# Patient Record
Sex: Male | Born: 1965 | Race: White | Hispanic: No | Marital: Married | Smoking: Former smoker
Health system: Southern US, Community
[De-identification: ages and names within clinical notes are randomized; demographics above are authoritative.]

## PROBLEM LIST (undated history)

## (undated) DIAGNOSIS — I10 Essential (primary) hypertension: Secondary | ICD-10-CM

## (undated) DIAGNOSIS — F419 Anxiety disorder, unspecified: Secondary | ICD-10-CM

## (undated) DIAGNOSIS — E785 Hyperlipidemia, unspecified: Secondary | ICD-10-CM

## (undated) DIAGNOSIS — T7840XA Allergy, unspecified, initial encounter: Secondary | ICD-10-CM

## (undated) DIAGNOSIS — M1811 Unilateral primary osteoarthritis of first carpometacarpal joint, right hand: Secondary | ICD-10-CM

## (undated) DIAGNOSIS — G4730 Sleep apnea, unspecified: Secondary | ICD-10-CM

## (undated) DIAGNOSIS — M6530 Trigger finger, unspecified finger: Secondary | ICD-10-CM

## (undated) DIAGNOSIS — H6690 Otitis media, unspecified, unspecified ear: Secondary | ICD-10-CM

## (undated) DIAGNOSIS — B019 Varicella without complication: Secondary | ICD-10-CM

## (undated) HISTORY — DX: Hyperlipidemia, unspecified: E78.5

## (undated) HISTORY — PX: SPINE SURGERY: SHX786

## (undated) HISTORY — DX: Sleep apnea, unspecified: G47.30

## (undated) HISTORY — PX: COLONOSCOPY: SHX174

## (undated) HISTORY — DX: Anxiety disorder, unspecified: F41.9

## (undated) HISTORY — PX: BILATERAL CARPAL TUNNEL RELEASE: SHX6508

## (undated) HISTORY — PX: EYE SURGERY: SHX253

## (undated) HISTORY — DX: Varicella without complication: B01.9

## (undated) HISTORY — DX: Otitis media, unspecified, unspecified ear: H66.90

## (undated) HISTORY — PX: HEMORROIDECTOMY: SUR656

## (undated) HISTORY — DX: Trigger finger, unspecified finger: M65.30

## (undated) HISTORY — PX: VASECTOMY: SHX75

## (undated) HISTORY — DX: Allergy, unspecified, initial encounter: T78.40XA

## (undated) HISTORY — DX: Unilateral primary osteoarthritis of first carpometacarpal joint, right hand: M18.11

## (undated) HISTORY — PX: TYMPANOSTOMY TUBE PLACEMENT: SHX32

---

## 2003-06-19 ENCOUNTER — Ambulatory Visit (HOSPITAL_BASED_OUTPATIENT_CLINIC_OR_DEPARTMENT_OTHER): Admission: RE | Admit: 2003-06-19 | Discharge: 2003-06-19 | Payer: Self-pay | Admitting: Internal Medicine

## 2004-10-25 ENCOUNTER — Emergency Department (HOSPITAL_COMMUNITY): Admission: EM | Admit: 2004-10-25 | Discharge: 2004-10-25 | Payer: Self-pay | Admitting: Emergency Medicine

## 2005-05-02 HISTORY — PX: NECK SURGERY: SHX720

## 2005-06-21 ENCOUNTER — Emergency Department (HOSPITAL_COMMUNITY): Admission: EM | Admit: 2005-06-21 | Discharge: 2005-06-21 | Payer: Self-pay | Admitting: Family Medicine

## 2005-09-28 ENCOUNTER — Emergency Department (HOSPITAL_COMMUNITY): Admission: EM | Admit: 2005-09-28 | Discharge: 2005-09-28 | Payer: Self-pay | Admitting: Emergency Medicine

## 2006-10-10 ENCOUNTER — Encounter: Admission: RE | Admit: 2006-10-10 | Discharge: 2006-10-10 | Payer: Self-pay | Admitting: *Deleted

## 2006-11-24 ENCOUNTER — Ambulatory Visit (HOSPITAL_COMMUNITY): Admission: RE | Admit: 2006-11-24 | Discharge: 2006-11-25 | Payer: Self-pay | Admitting: Neurological Surgery

## 2006-12-18 ENCOUNTER — Encounter: Admission: RE | Admit: 2006-12-18 | Discharge: 2006-12-18 | Payer: Self-pay | Admitting: Neurological Surgery

## 2007-02-19 ENCOUNTER — Encounter: Admission: RE | Admit: 2007-02-19 | Discharge: 2007-02-19 | Payer: Self-pay | Admitting: Neurological Surgery

## 2007-05-04 ENCOUNTER — Emergency Department (HOSPITAL_COMMUNITY): Admission: EM | Admit: 2007-05-04 | Discharge: 2007-05-04 | Payer: Self-pay | Admitting: Emergency Medicine

## 2007-05-24 ENCOUNTER — Encounter (INDEPENDENT_AMBULATORY_CARE_PROVIDER_SITE_OTHER): Payer: Self-pay | Admitting: General Surgery

## 2007-05-24 ENCOUNTER — Ambulatory Visit (HOSPITAL_BASED_OUTPATIENT_CLINIC_OR_DEPARTMENT_OTHER): Admission: RE | Admit: 2007-05-24 | Discharge: 2007-05-24 | Payer: Self-pay | Admitting: General Surgery

## 2007-10-03 ENCOUNTER — Inpatient Hospital Stay (HOSPITAL_COMMUNITY): Admission: EM | Admit: 2007-10-03 | Discharge: 2007-10-04 | Payer: Self-pay | Admitting: Emergency Medicine

## 2007-10-04 ENCOUNTER — Encounter: Payer: Self-pay | Admitting: Cardiology

## 2008-01-13 ENCOUNTER — Emergency Department (HOSPITAL_COMMUNITY): Admission: EM | Admit: 2008-01-13 | Discharge: 2008-01-13 | Payer: Self-pay | Admitting: Emergency Medicine

## 2008-02-05 ENCOUNTER — Encounter: Admission: RE | Admit: 2008-02-05 | Discharge: 2008-02-27 | Payer: Self-pay | Admitting: Family Medicine

## 2008-04-09 ENCOUNTER — Emergency Department (HOSPITAL_COMMUNITY): Admission: EM | Admit: 2008-04-09 | Discharge: 2008-04-09 | Payer: Self-pay | Admitting: Emergency Medicine

## 2008-06-05 IMAGING — RF DG CERVICAL SPINE 1V
1 series · 1 of 1 positions shown · non-contrast
Comparison: MRI of the cervical spine of 10/10/06.

CLINICAL DATA: HNP, C5, C6 ACDF.
 CERVICAL SPINE ? 1 VIEW:

[Series 1: run · 1 of 1 slices shown]
[im 1/1]
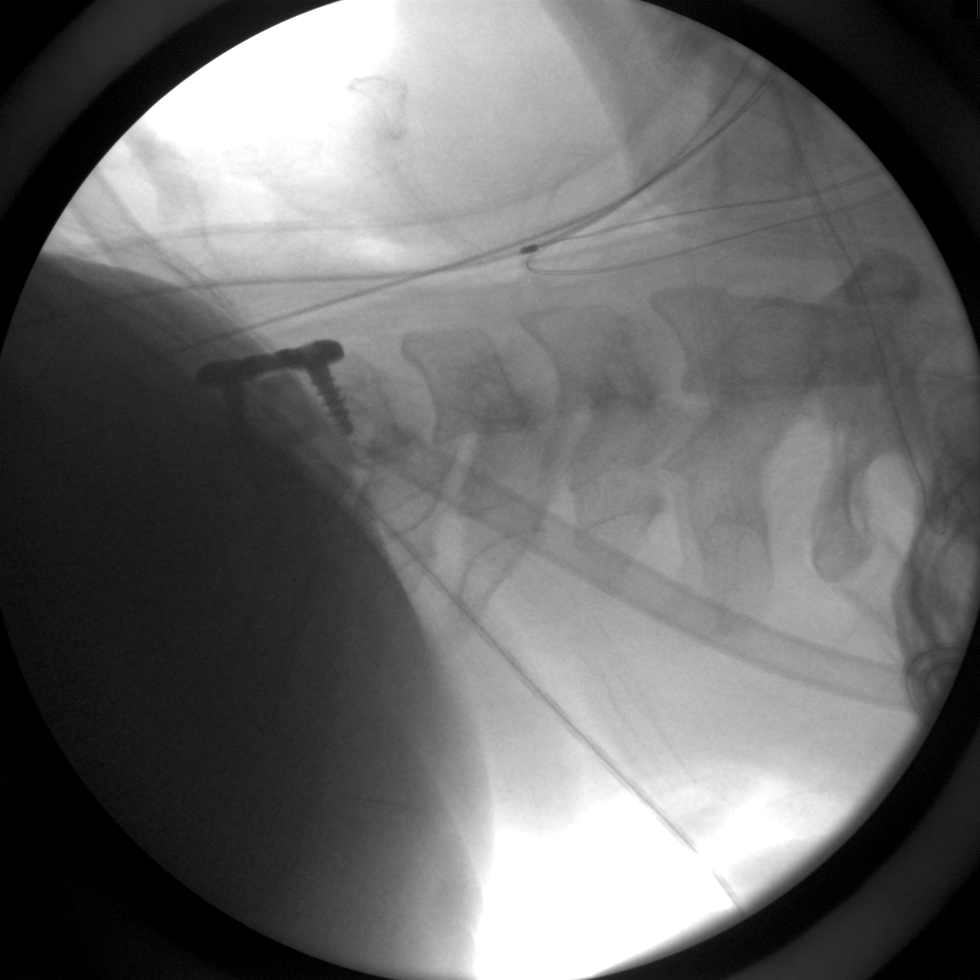

[1 of 1 positions shown; findings below may reference images not displayed]

FINDINGS: Single intraoperative spot fluoroscopic crosstable view of the cervical spine was performed.  Cervical spine is visualized from the skull base through the inferior aspect of C5.  The C6-C7 vertebral bodies are obscured by overlying soft tissue of the shoulder.  The visualized vertebral bodies are normally aligned.  Postsurgical changes of anterior cervical discectomy and fusion at C5 and C6 are noted.  Numerous wires and tubes project over the imaged portion of the neck.
IMPRESSION: Limited fluoroscopic view of the cervical spine shows postsurgical changes of C5, C6 ACDF.

## 2009-02-20 ENCOUNTER — Emergency Department (HOSPITAL_COMMUNITY): Admission: EM | Admit: 2009-02-20 | Discharge: 2009-02-20 | Payer: Self-pay | Admitting: Family Medicine

## 2009-06-02 ENCOUNTER — Emergency Department (HOSPITAL_COMMUNITY): Admission: EM | Admit: 2009-06-02 | Discharge: 2009-06-02 | Payer: Self-pay | Admitting: Family Medicine

## 2010-02-18 ENCOUNTER — Emergency Department (HOSPITAL_COMMUNITY): Admission: EM | Admit: 2010-02-18 | Discharge: 2010-02-18 | Payer: Self-pay | Admitting: Emergency Medicine

## 2010-09-14 NOTE — H&P (Signed)
NAME:  Timothy Jennings, Timothy Jennings               ACCOUNT NO.:  [id number]   MEDICAL RECORD NO.:  [id number]          PATIENT TYPE:  INP   LOCATION:  1431                         FACILITY:  [facility] : [facility]   PHYSICIAN:  Corinna L. Lendell Caprice, MDDATE OF BIRTH:  [birth year]   DATE OF ADMISSION:  10/03/2007  DATE OF DISCHARGE:                              HISTORY & PHYSICAL   CHIEF COMPLAINT:  Chest pain and fluttering.   HISTORY OF PRESENT ILLNESS:  Timothy Jennings is a 45 year old white male  patient of Dr. Azucena Cecil, who presents with recurrent episodes of  substernal chest pressure over the past 3 days.  Some, but not all are  related to exertion.  He also felt short of breath and felt palpitations  with these episodes.  He smokes two packs of cigarettes a day.  He had a  history of hyperlipidemia in the past, but is not on any medication for  this.  He denies any cocaine use and has never felt similar symptoms.  He has never had a cardiac workup.  The patient has had no anxiety or  weight loss.  He had no dizziness, diaphoresis or nausea.  He felt an  episode of chest fluttering and pressure when in radiology and received  nitroglycerin sublingually, which helped.  He was given aspirin in the  emergency room.   PAST MEDICAL HISTORY:  As above.   MEDICATIONS:  Tylenol Allergy daily.   SOCIAL HISTORY:  The patient smokes two packs of cigarettes a day.  He  works in Production designer, theatre/television/film.  He denies drug use.  He does admit to daily  alcohol use, 1-2 drinks a day.  He denies history of DTs or excessive  alcohol use.   FAMILY HISTORY:  Negative for heart disease.   REVIEW OF SYSTEMS:  As above, otherwise negative.   PHYSICAL EXAMINATION:  VITAL SIGNS:  His temperature is 98.5, pulse 70,  respiratory rate 18, blood pressure 117/61.  GENERAL:  The patient is well-nourished, well-developed in no acute  distress.  HEENT:  Normocephalic, atraumatic.  Pupils equal, round and reactive to  light.  He is a disconjugate gaze  which is chronic.  He has moist mucous  membranes.  NECK:  Supple.  No carotid bruits.  LUNGS:  Clear to auscultation bilaterally without wheezes, rhonchi or  rales.  CHEST:  He does have some chest wall tenderness, which reproduces some  of the chest pressure, but not the fluttering sensation.  ABDOMEN:  Soft, nontender, nondistended.  GU/RECTAL:  Deferred.  EXTREMITIES:  No clubbing, cyanosis or edema.  Denna Haggard' sign negative.  No calf tenderness.   LABORATORY DATA:  Ionized calcium, hemoglobin, hematocrit, basic  metabolic panel, bicarbonate all normal.  Point of care enzymes normal.  EKG shows normal sinus rhythm.  Chest x-ray shows chronic lung changes,  nothing acute.   ASSESSMENT/PLAN:  1. Chest pain and palpitations.  I will check a TSH and rule out      myocardial infarction.  Start Protonix and give morphine as needed.      Deep venous thrombosis prophylaxis.  Continue aspirin.  I have  consulted Dr. Anne Fu for recommendations.  The patient will be on      telemetry.  2. Tobacco use, counseled against.  I will get a cessation consult.  3. Possible history of hyperlipidemia.  I will check fasting lipids in      the morning.  4. Daily alcohol use.  The patient will get thiamine, although he      denies alcohol abuse.      Corinna L. Lendell Caprice, MD  Electronically Signed     CLS/MEDQ  D:  10/03/2007  T:  10/03/2007  Job:  [id number]   cc:   Tally Joe, M.D.  Fax: [phone]   Jake Bathe, MD  Fax: [phone]

## 2010-09-14 NOTE — Op Note (Signed)
NAMENOBUO, NUNZIATA               ACCOUNT NO.:  [id number]   MEDICAL RECORD NO.:  [id number]          PATIENT TYPE:  AMB   LOCATION:  SDS                          FACILITY:  [facility]   PHYSICIAN:  Tia Alert, MD     DATE OF BIRTH:  May 22, 1965   DATE OF PROCEDURE:  11/24/2006  DATE OF DISCHARGE:                               OPERATIVE REPORT   PREOPERATIVE DIAGNOSIS:  Cervical spondylosis with cervical disc  herniation C5-C6 to the right with right C6 radiculopathy.   POSTOPERATIVE DIAGNOSIS:  Cervical spondylosis with cervical disc  herniation C5-C6 to the right with right C6 radiculopathy.   PROCEDURE:  1. Depressive anterior cervical discectomy C5-C6.  2. Anterior cervical arthrodesis C5-C6 utilizing a 6-mm      corticocancellous allograft.  3. Anterior cervical plating C5-C6 utilizing a 23-mm Venture plate.   SURGEON:  Tia Alert, M.D.   ASSISTANT:  Kathaleen Maser. Pool, M.D.   ANESTHESIA:  General endotracheal anesthesia.   COMPLICATIONS:  None apparent.   INDICATIONS FOR PROCEDURE:  Mr. Smoak is a very pleasant 45 year old  gentleman who is referred with neck pain which radiated into his  interscapular region into his shoulder and down his arm with numbness  and tingling in his hand.  He had an MRI which showed cervical  spondylosis to the right at C5-C6 with the suggestion of neural  foraminal narrowing at C5-C6 to the right with compression of the right  C6 nerve root.  We talked about options of treatment. After a discussion  of the risks, benefits and expected outcome of each option, he chose to  proceed with a ACDF with plating at C5-C6.  He understood the risks,  benefits, and expected outcome and wished to proceed.   DESCRIPTION OF PROCEDURE:  The patient was taken to the operating room.  After induction of adequate generalized endotracheal anesthesia, he was  placed in the supine position on the operating table.  The right  anterior cervical region was  prepped with DuraPrep and draped in the  usual sterile fashion.  5 mL local anesthesia was injected and a  transverse incision was made to the right of the midline and carried  down to the platysma which was elevated, opened, and undermined with  Metzenbaum scissors.  I then dissected a plane medial to the  sternocleidomastoid muscle, internal carotid artery, and lateral to the  trachea and esophagus, to expose C5-C6.  Intraoperative fluoroscopy  confirmed my level at C5-C6 and then the annulus was incised and the  initial discectomy was done with pituitary rongeurs and curved curets.  The Shadowline retractors were placed under the longus colli muscles to  expose this area.   I then used a high speed drill to drill the endplates in a rectangular  fashion.  I drilled the disc space to a height of 6 mm.  I took off the  anterior inferior lip of C5 to expose the disc space.  I then brought in  the operating microscope.  The posterior longitudinal ligament was  opened with a nerve hook and then removed in  a circumferential fashion  while undercutting the bodies of C5 and C6. Bilateral foraminotomies  were performed.  We spent considerable time in the right foramen because  of the right arm pain. We marched along the pedicle and identified the  nerve root.  The nerve root had superior to inferior trajectory;  therefore, we undercut the body of C5, followed the nerve root out into  the foramen laterally, and fully exposed the right C6 nerve root.  We  then palpated with a nerve hook to assure adequate decompression of the  nerve root.  We palpated circumferentially. We felt like we had a good  decompression of the central canal and the nerve roots bilaterally.   Therefore, we measured the interspace to be 6 mm.  We dried our surgical  bed with Gelfoam, irrigated with saline solution, and then placed a 6 mm  corticocancellous allograft into the interspace at C5-C6.  We then used  a 23 mm  Venture plate and placed two 13 mm variable angle screws in the  bodies of C5 and C6 and these locked into the plate by the locking  mechanism within the plate.  We then irrigated with saline solution and  dried all bleeding points with bipolar cautery.  Once meticulous  hemostasis was achieved, we closed the platysma with 3-0 Vicryl, closed  the subcuticular tissue with 3-0 Vicryl, closed the skin with Benzoin  and Steri-Strips.  The drapes were removed.  A sterile dressing was  applied.  The patient was awakened from general anesthesia and  transferred to the recovery room in stable condition.  At the end of the  procedure, all sponge, needle and instrument counts were correct.      Tia Alert, MD  Electronically Signed     DSJ/MEDQ  D:  11/24/2006  T:  11/25/2006  Job:  [id number]

## 2010-09-14 NOTE — Op Note (Signed)
NAMEINOCENTE, KRACH               ACCOUNT NO.:  [id number]   MEDICAL RECORD NO.:  [id number]          PATIENT TYPE:  AMB   LOCATION:  DSC                          FACILITY:  [facility]   PHYSICIAN:  Cherylynn Ridges, M.D.    DATE OF BIRTH:  July 31, 1965   DATE OF PROCEDURE:  05/24/2007  DATE OF DISCHARGE:  05/04/2007                               OPERATIVE REPORT   PREOPERATIVE DIAGNOSIS:  Internal and external hemorrhoids.   POSTOPERATIVE DIAGNOSIS:  Internal and external hemorrhoids large at the  7 o'clock position, with 6 o'clock being directly posterior and moderate  size internal hemorrhoid at the 11 o'clock position.   SURGEON:  Cherylynn Ridges, M.D.   ANESTHESIA:  General with a laryngeal airway.   ESTIMATED BLOOD LOSS:  Less than 20 mL.   COMPLICATIONS:  None.   CONDITION:  Stable.   INDICATIONS FOR OPERATION:  The patient is a 45 year old that we  attempted to treat in the office with rubber band ligation and injection  for internal external hemorrhoids, who continues be symptomatic, comes  in now for an exam under anesthesia and hemorrhoidectomy.   PROCEDURE:  1. Exam under anesthesia.  2. Internal and external hemorrhoidectomy.  3. Rubber band ligation of moderate size internal hemorrhoid.   SPECIMENS SENT:  The internal and external hemorrhoids.   FINDINGS:  The patient had large internal and external hemorrhoids at 7  o'clock position with 12 o'clock being directly anterior, 6 o'clock  directly posterior and a moderate sized internal hemorrhoid at the 11  o'clock position.   OPERATION:  The patient was taken to the operating room, placed on table  in supine position.  After an adequate general laryngeal airway  anesthetic was administered, he was placed in lithotomy and then prepped  and draped in usual sterile manner.   Circumferential anoscopy with an anal speculum was performed initially  identifying moderate-sized hemorrhoid at the 11 o'clock position with 12  o'clock being directly anterior and a large internal and external  hemorrhoids at approximately the 7 o'clock position with 6 o'clock being  directly posterior.  With these being identified we went ahead and  placed anal speculum into position in order to adequately expose the  hemorrhoid at 7 o'clock position.  3-0 chromic stitch was placed at the  base of the internal component of the internal and external hemorrhoid.  Once this was done we used a 15 blade in order to excise the hemorrhoid  with electrocautery being used so as to obtain hemostasis at the denuded  mucosal tissue.  Care was taken not to incise into the external  sphincter.  Once we had control of hemostasis as well as we could with  the electrocautery, we closed the mucosal edges using running locking  stitch of the 3-0 chromic suture which had been placed at the base of  the hemorrhoid.  We brought that externally, leaving a small area open  externally in order to allow for drainage.   Once this was done, we adjusted the speculum in order to identify the  moderate-sized hemorrhoid at 11 o'clock  position.  A rubber band  ligation technique was placed on this hemorrhoids.  The initial one came  off because the lubricant which had been used; however, the second one  did remain in place.  We injected 0.25% Marcaine with epi into the  submucosal site near the excision site and then we placed a roll of  Gelfoam and dibucaine ointment into the rectal area for postoperative  hemostasis and anesthesia.  All counts were correct including needles,  sponges and instruments.  Dressing was applied.      Cherylynn Ridges, M.D.  Electronically Signed     JOW/MEDQ  D:  05/24/2007  T:  05/24/2007  Job:  [id number]   cc:   Tally Joe, M.D.

## 2010-09-14 NOTE — Consult Note (Signed)
Timothy Jennings, NYDAM               ACCOUNT NO.:  [id number]   MEDICAL RECORD NO.:  [id number]          PATIENT TYPE:  INP   LOCATION:  1431                         FACILITY:  [facility]   PHYSICIAN:  Jake Bathe, MD      DATE OF BIRTH:  20-Mar-1966   DATE OF CONSULTATION:  10/03/2007  DATE OF DISCHARGE:                                 CONSULTATION   PRIMARY CARE PHYSICIAN:  Tally Joe, M.D.   REFERRING PHYSICIAN:  Corinna L. Lendell Caprice, M.D.   REASON FOR CONSULTATION:  Timothy Jennings is being seen at the request of  Dr. Lendell Caprice for the evaluation of chest pain.   HISTORY OF PRESENT ILLNESS:  Timothy Jennings is a 45 year old male with  cardiac risk factors of tobacco use who over the past 3 to 4 days has  been experiencing chest pain, fluttering, a butterfly-like sensation,  usually lasting 1 to 5 minutes in duration, substernal with no radiation  but with associated shortness of breath that first occurred while  sitting watching TV, occurring several different times throughout the  day.  He did notice today that while he was traversing a stairway that  pain increased in intensity, was associated with more chest tightness,  and it worried him.  This prompted his evaluation at [facility]  emergency department.  Prior to this experience, he has not had any  chest pain and has no other significant past medical history.   PAST MEDICAL HISTORY:  None except for tobacco abuse.   PAST SURGICAL HISTORY:  Prior cervical neck bone spur removal, prior  knee surgery.   ALLERGIES:  No known drug allergies.   MEDICATIONS:  None currently.   FAMILY HISTORY:  No early family history of coronary artery disease.  His mother's father did have bypass surgery at a later age.   SOCIAL HISTORY:  He smokes, drinks alcohol.  No illicit drug use.  He is  a Games developer.  He recently separated from his wife, Timothy Jennings, on whom I had consulted previously in November.  She is now  living in [place]  with their child.   REVIEW OF SYSTEMS:  No syncope.  No bleeding.  No visual disturbance.  No joint pain.  No orthopnea or PND.  If not specified above, all other  12 review of systems negative.   PHYSICAL EXAMINATION:  VITAL SIGNS:  Temperature 98.5, pulse 70 to 86,  respirations 18, blood pressure 117 to 140 over 61 to 82.  Satting 96%  on 2 liters.  GENERAL:  Alert and oriented x 3, in no acute distress, comfortable in  bed with his laptop at side.  HEENT:  Well-perfused conjunctivae.  EOMI.  No scleral icterus.  NECK:  Supple.  No JVD.  No carotid bruits.  No lymphadenopathy.  No  thyromegaly.  CARDIOVASCULAR:  Regular rate and rhythm.  No murmurs, rubs or gallops.  Normal PMI.  LUNGS:  Clear to auscultation bilaterally.  Normal respiratory effort.  Mild crackles heard initially at bases, which cleared.  ABDOMEN:  Soft, nontender.  Normoactive bowel sounds.  No rebound, no  guarding.  No bruits.  EXTREMITIES:  No clubbing, cyanosis or edema.  Normal distal pulses.  NEUROLOGIC:  Nonfocal.  No tremors.  SKIN:  Skin is tan.  No rashes noted.   LABORATORIES:  EKG normal sinus rhythm with no ST changes, rate 76,  normal intervals.  Chest x-ray personally reviewed showed no acute air  space disease.  Labs:  Sodium 139, potassium 4.1, BUN 15, creatinine  1.0, glucose 91.  First set of cardiac biomarkers are normal.   ASSESSMENT/PLAN:  A 45 year old male with new onset chest pain -  atypical with palpitations and associated dyspnea.  1. Chest pain - fairly atypical presentation.  First set of cardiac      enzymes is reassuring given the occurrence of this pain over the      past 3 or 4 days.  ECG also unremarkable.  Chest x-ray      unremarkable.  Possible etiologies include angina, musculoskeletal,      GERD, or discomfort associated with concurrent palpitations.  Main      cardiac risk factor is smoking.  We will continue to cycle cardiac      enzymes.  Will place on aspirin and  low-dose beta blocker.      Depending on cardiac enzymes, we will continue with further risk      stratification with either a stress test or cardiac catheterization      if biomarkers, chest pain or ECG become concerning.  We will make      further decisions in the a.m.  2. Tobacco abuse - counseled on cessation at length.   We will follow up with the patient and make determination in a.m. versus  further testing.      Jake Bathe, MD  Electronically Signed     MCS/MEDQ  D:  10/03/2007  T:  10/03/2007  Job:  [id number]   cc:   Corinna L. Lendell Caprice, MD

## 2010-09-17 NOTE — Discharge Summary (Signed)
NAME:  ANDYN, SALES               ACCOUNT NO.:  [id number]   MEDICAL RECORD NO.:  [id number]          PATIENT TYPE:  INP   LOCATION:  1431                         [facility]ACILITY[facility]  [facility]   PHYSICIAN:  Corinna L. Lendell Caprice, MDDATE OF BIRTH:  06-15-1965   DATE OF ADMISSION:  10/03/2007  DATE OF DISCHARGE:  10/04/2007                               DISCHARGE SUMMARY   S   DISCHARGE DIAGNOSES:  1. Chest pain, MI ruled out.  2. Tobacco abuse, counseled against.  3. Reported history of hyperlipidemia, LDL during this hospitalization      was 127.   DISCHARGE MEDICATIONS:  1. Prilosec 20 mg a day.   FOLLOWUP:  Follow up with Dr. Azucena Cecil.   CONDITION:  Stable.   CONSULTATIONS:  Dr. Jake Bathe, MD.   PROCEDURES:  None.   DISCHARGE INSTRUCTIONS:  1. Diet:  Low cholesterol.  2. Activity:  Ad lib.   LABORATORY DATA:  Serial cardiac enzymes negative.  Complete metabolic  panel, CBC and D-dimer, unremarkable.  Total cholesterol 179,  triglycerides 141, HDL of 24, LDL 127.  TSH 1.541.   SPECIAL STUDIES/RADIOLOGY:  Chest x-ray showed chronic lung changes,  nothing acute.  Stress Cardiolite showed normal study, ejection fraction  58%.  EKG showed normal sinus rhythm.   HISTORY AND HOSPITAL COURSE:  Mr. Trull is a 45 year old white male  with history of tobacco abuse and reported hyperlipidemia who presented  with 3 days worth of recurrent substernal chest pressure.  It was  sometimes related to exertion.  He also felt palpitations.  He had  normal vital signs.  He had some reproducible chest wall tenderness.  The patient was admitted to telemetry where he ruled out for MI.  Stress  Cardiolite was negative.  He was encouraged to quit smoking and started  on Prilosec.  D-dimer also was negative.      Corinna L. Lendell Caprice, MD  Electronically Signed     CLS/MEDQ  D:  12/12/2007  T:  12/13/2007  Job:  [id number]

## 2011-01-20 LAB — DIFFERENTIAL
Basophils Absolute: 0
Eosinophils Relative: 1
Lymphocytes Relative: 15
Neutro Abs: 8.8 — ABNORMAL HIGH

## 2011-01-20 LAB — BASIC METABOLIC PANEL
CO2: 27
Creatinine, Ser: 0.89
GFR calc Af Amer: >60
GFR calc non Af Amer: >60
Sodium: 137

## 2011-01-20 LAB — CBC
HCT: 45.9
MCV: 89.7
RDW: 12.9
WBC: 11.1 — ABNORMAL HIGH

## 2011-01-27 LAB — POCT I-STAT, CHEM 8
BUN: 15
Glucose, Bld: 91
Hemoglobin: 16
Sodium: 139

## 2011-01-27 LAB — POCT CARDIAC MARKERS
Myoglobin, poc: 40.2
Operator id: 280141

## 2011-01-27 LAB — LIPID PANEL
Cholesterol: 179
LDL Cholesterol: 127 — ABNORMAL HIGH
Triglycerides: 141
VLDL: 28

## 2011-01-27 LAB — CBC
HCT: 43.5
MCV: 89.8
RDW: 13
WBC: 8.2

## 2011-01-27 LAB — CARDIAC PANEL(CRET KIN+CKTOT+MB+TROPI)
Relative Index: 0.8
Total CK: 123
Total CK: 133
Troponin I: 0.02
Troponin I: 0.02

## 2011-01-27 LAB — TSH: TSH: 1.541

## 2011-01-27 LAB — HEPATIC FUNCTION PANEL
AST: 18
Indirect Bilirubin: 0.7

## 2011-02-14 LAB — DIFFERENTIAL
Basophils Absolute: 0.1
Basophils Relative: 1
Eosinophils Absolute: 0.2
Eosinophils Relative: 2
Lymphocytes Relative: 16
Monocytes Absolute: 0.8 — ABNORMAL HIGH
Monocytes Relative: 6
Neutrophils Relative %: 76

## 2011-02-14 LAB — CBC
HCT: 46.6
MCV: 89.6
RBC: 5.2
RDW: 13.5
WBC: 13.7 — ABNORMAL HIGH

## 2011-02-14 LAB — APTT: aPTT: 28

## 2011-02-14 LAB — BASIC METABOLIC PANEL
Chloride: 104
Glucose, Bld: 84

## 2011-11-15 ENCOUNTER — Emergency Department (HOSPITAL_COMMUNITY): Payer: Managed Care, Other (non HMO)

## 2011-11-15 ENCOUNTER — Encounter (HOSPITAL_COMMUNITY): Payer: Self-pay | Admitting: Emergency Medicine

## 2011-11-15 ENCOUNTER — Emergency Department (HOSPITAL_COMMUNITY)
Admission: EM | Admit: 2011-11-15 | Discharge: 2011-11-15 | Disposition: A | Discharge status: Home / Self Care | Payer: Managed Care, Other (non HMO) | Attending: Emergency Medicine | Admitting: Emergency Medicine

## 2011-11-15 DIAGNOSIS — W01119A Fall on same level from slipping, tripping and stumbling with subsequent striking against unspecified sharp object, initial encounter: Secondary | ICD-10-CM | POA: Insufficient documentation

## 2011-11-15 DIAGNOSIS — IMO0002 Reserved for concepts with insufficient information to code with codable children: Secondary | ICD-10-CM

## 2011-11-15 DIAGNOSIS — Y92009 Unspecified place in unspecified non-institutional (private) residence as the place of occurrence of the external cause: Secondary | ICD-10-CM | POA: Insufficient documentation

## 2011-11-15 DIAGNOSIS — S61509A Unspecified open wound of unspecified wrist, initial encounter: Secondary | ICD-10-CM | POA: Insufficient documentation

## 2011-11-15 DIAGNOSIS — W268XXA Contact with other sharp object(s), not elsewhere classified, initial encounter: Secondary | ICD-10-CM | POA: Insufficient documentation

## 2011-11-15 DIAGNOSIS — Z23 Encounter for immunization: Secondary | ICD-10-CM | POA: Insufficient documentation

## 2011-11-15 MED ORDER — TETANUS-DIPHTH-ACELL PERTUSSIS 5-2.5-18.5 LF-MCG/0.5 IM SUSP
0.5000 mL | Freq: Once | INTRAMUSCULAR | Status: AC
  Administered 2011-11-15: 0.5 mL via INTRAMUSCULAR
  Filled 2011-11-15: qty 0.5

## 2011-11-15 NOTE — ED Notes (Signed)
Neva Seat, PA at bedside suturing pt.

## 2011-11-15 NOTE — ED Provider Notes (Signed)
History     CSN: [id number]  Arrival date & time 11/15/11  1735   First MD Initiated Contact with Patient 11/15/11 1835      Chief Complaint  Patient presents with  . Extremity Laceration    (Consider location/radiation/quality/duration/timing/severity/associated sxs/prior treatment) HPI  [facility] department with complaints of laceration to left wrist. He states that his dog data to see him and tripped him and fell on some sheet metal. He denies having pain to the area. He says his last tetanus shot was more than 10 years ago. He denies head pain or injury to another area of his body. Pt is in NAD, bleeding controlled and vital signs stable  History reviewed. No pertinent past medical history.  History reviewed. No pertinent past surgical history.  No family history on file.  History  Substance Use Topics  . Smoking status: Not on file  . Smokeless tobacco: Not on file  . Alcohol Use: Not on file      Review of Systems   HEENT: denies blurry vision or change in hearing PULMONARY: Denies difficulty breathing and SOB CARDIAC: denies chest pain or heart palpitations MUSCULOSKELETAL:  denies being unable to ambulate ABDOMEN AL: denies abdominal pain GU: denies loss of bowel or urinary control NEURO: denies numbness and tingling in extremities SKIN: no new rashes, + laceration to left wrist PSYCH: patient denies anxiety or depression. NECK: Pt denies having neck pain     Allergies  Review of patient's allergies indicates no known allergies.  Home Medications   Current Outpatient Rx  Name Route Sig Dispense Refill  . DEXBROMPHENIRAMINE-PSE ER 6-120 MG PO TB12 Oral Take 1 tablet by mouth every 12 (twelve) hours as needed. For congestion      BP 148/89  Pulse 85  Temp 98.8 F (37.1 C) (Oral)  SpO2 98%  Physical Exam  Nursing note and vitals reviewed. Constitutional: He appears well-developed and well-nourished. No distress.  HENT:  Head: Normocephalic and  atraumatic.  Eyes: Pupils are equal, round, and reactive to light.  Neck: Normal range of motion. Neck supple.  Cardiovascular: Normal rate and regular rhythm.   Pulmonary/Chest: Effort normal.  Abdominal: Soft.  Neurological: He is alert.  Skin: Skin is warm and dry.       1.5 cm laceration over distal radial head  Cap refill < 3 seconds FROM No numbness or tingling to fingers     ED Course  Procedures (including critical care time)  Labs Reviewed - No data to display Dg Wrist Complete Left  11/15/2011  *RADIOLOGY REPORT*  Clinical Data: Fall today.  Laceration to the anterior and ulnar aspect of wrist.  LEFT WRIST - COMPLETE 3+ VIEW  Comparison: None.  Findings: No acute fracture or dislocation.  Scaphoid intact.  No definite soft tissue injury. No radio-opaque foreign body.  IMPRESSION: No acute osseous abnormality.  Original Report Authenticated By: Consuello Bossier, M.D.     1. Laceration       MDM  .LACERATION REPAIR Performed by: Dorthula Matas Authorized by: Dorthula Matas Consent: Verbal consent obtained. Risks and benefits: risks, benefits and alternatives were discussed Consent given by: patient Patient identity confirmed: provided demographic data Prepped and Draped in normal sterile fashion Wound explored  Laceration Location: left wrist  Laceration Length: 1.5 cm  No Foreign Bodies seen or palpated  Anesthesia: local infiltration  Local anesthetic: lidocaine 2% wo epinephrine  Anesthetic total: 3 ml  Irrigation method: syringe Amount of cleaning: standard  Skin closure:  sutures  Number of sutures: 5  Technique: simple interrupted.  Patient tolerance: Patient tolerated the procedure well with no immediate complications.   Pt given tetanus shot in ED. Advised on s/sx that warrant return to ED. Wound cleaned thoroughly. To return to ER in 7-10 days for suture removal.  Pt has been advised of the symptoms that warrant their return to the  ED. Patient has voiced understanding and has agreed to follow-up with the PCP or specialist.       Dorthula Matas, PA 11/15/11 1925

## 2011-11-15 NOTE — ED Notes (Signed)
Pt states he had his last tetanus shot in 2002.

## 2011-11-15 NOTE — ED Notes (Signed)
Pt has laceration to L lateral wrist. Pt states he tripped and fell and landed on some sharp sheet metal. Lac to L lateral wrist. No bleeding at present.

## 2011-11-16 NOTE — ED Provider Notes (Signed)
Medical screening examination/treatment/procedure(s) were performed by non-physician practitioner and as supervising physician I was immediately available for consultation/collaboration.   Gwyneth Sprout, MD 11/16/11 2311

## 2012-02-20 ENCOUNTER — Ambulatory Visit (INDEPENDENT_AMBULATORY_CARE_PROVIDER_SITE_OTHER): Payer: Managed Care, Other (non HMO) | Admitting: Family Medicine

## 2012-02-20 ENCOUNTER — Encounter: Payer: Self-pay | Admitting: Family Medicine

## 2012-02-20 VITALS — BP 118/80 | HR 94 | Temp 98.9°F | Ht 70.0 in | Wt 213.0 lb

## 2012-02-20 DIAGNOSIS — Z309 Encounter for contraceptive management, unspecified: Secondary | ICD-10-CM

## 2012-02-20 DIAGNOSIS — Z0000 Encounter for general adult medical examination without abnormal findings: Secondary | ICD-10-CM

## 2012-02-20 DIAGNOSIS — Z299 Encounter for prophylactic measures, unspecified: Secondary | ICD-10-CM

## 2012-02-20 DIAGNOSIS — IMO0001 Reserved for inherently not codable concepts without codable children: Secondary | ICD-10-CM

## 2012-02-20 LAB — COMPREHENSIVE METABOLIC PANEL
AST: 25 U/L (ref 0–37)
Albumin: 4.1 g/dL (ref 3.5–5.2)
Alkaline Phosphatase: 61 U/L (ref 39–117)
Glucose, Bld: 76 mg/dL (ref 70–99)
Potassium: 4 mEq/L (ref 3.5–5.1)
Sodium: 139 mEq/L (ref 135–145)
Total Bilirubin: 0.3 mg/dL (ref 0.3–1.2)
Total Protein: 7.5 g/dL (ref 6.0–8.3)

## 2012-02-20 LAB — LDL CHOLESTEROL, DIRECT: Direct LDL: 124.7 mg/dL

## 2012-02-20 LAB — LIPID PANEL
HDL: 37.5 mg/dL — ABNORMAL LOW (ref >39.00)
Total CHOL/HDL Ratio: 6
VLDL: 74 mg/dL — ABNORMAL HIGH (ref 0.0–40.0)

## 2012-02-20 LAB — HEMOGLOBIN A1C: Hgb A1c MFr Bld: 5.4 % (ref 4.6–6.5)

## 2012-02-20 NOTE — Progress Notes (Signed)
Chief Complaint  Patient presents with  . Establish Care    HPI: Timothy Jennings is here to establish care. Needs PCP.  Has the following concerns today: -wants basic labs and physical - hx of hyperlipidemia -no depression -wants referral for vasectomy  Other Providers: -[facility]: for recurrent otitis media, tube in R ear  Flu vaccine: Does not want flu vaccine Had tetanus this year  ROS: See pertinent positives and negatives per HPI.  Past Medical History  Diagnosis Date  . Otitis media     followed by ENT     Family History  Problem Relation Age of Onset  . Hypertension Paternal Grandmother   . Heart disease Paternal Grandmother   . Hypertension Paternal Grandfather   . Heart disease Paternal Grandfather     History   Social History  . Marital Status: Divorced    Spouse Name: N/A    Number of Children: N/A  . Years of Education: N/A   Social History Main Topics  . Smoking status: Former Games developer  . Smokeless tobacco: None  . Alcohol Use: 8.4 oz/week    14 Cans of beer per week     14 per week  . Drug Use: None  . Sexually Active: None   Other Topics Concern  . None   Social History Narrative  . None    Current outpatient prescriptions:dexbrompheniramine-pseudoephedrine (DRIXORAL) 6-120 MG per tablet, Take 1 tablet by mouth every 12 (twelve) hours as needed. For congestion, Disp: , Rfl:   EXAM:  Filed Vitals:   02/20/12 1420  BP: 118/80  Pulse: 94  Temp: 98.9 F (37.2 C)    Body mass index is 30.56 kg/(m^2).  GENERAL: vitals reviewed and listed above, alert, oriented, appears well hydrated and in no acute distress  HEENT: atraumatic, conjunttiva clear, no obvious abnormalities on inspection of external nose and ears  NECK: no obvious masses on inspection  LUNGS: clear to auscultation bilaterally, no wheezes, rales or rhonchi, good air movement  CV: HRRR, no peripheral edema  MS: moves all extremities without noticeable  abnormality  PSYCH: pleasant and cooperative, no obvious depression or anxiety  ASSESSMENT AND PLAN:  Discussed the following assessment and plan:  1. Preventive measure  Lipid Panel, Hemoglobin A1c, CMP  2. Contraception  Ambulatory referral to Urology   -We reviewed the PMH, PSH, FH, SH, Meds and Allergies. -We addressed current concerns per orders and patient instructions. -Level A and B USPSTF recommendations reviewed and counseling provided -alcohol safe drinking counseling provided -We have advised patient to follow up per instructions below. -Influenza vaccine given today  -Patient advised to return or notify a doctor immediately if symptoms worsen or persist or new concerns arise.  Patient Instructions  -We have ordered labs or studies at this visit. It usually takes 1-2 weeks for results and processing. We will contact you with instructions IF your results are abnormal. Normal results will be released to your [facility] in 1-2 weeks. If you have not heard from [place] or can not find your results in [facility] in 2 weeks please contact our office.  -We placed a referral for you as discussed. It usually takes about 1-2 weeks to process and schedule this referral. If you have not heard from [place] regarding this appointment in 2 weeks please contact our office.  Thank you for enrolling in MyChart. Please follow the instructions below to securely access your online medical record. MyChart allows you to send messages to your doctor, view your  test results, renew your prescriptions, schedule appointments, and more.  How Do I Sign Up? 1. In your Internet browser, go to [email or web address] 2. Click on the New  User? link in the Sign In box.  3. Enter your MyChart Access Code exactly as it appears below. You will not need to use this code after you have completed the sign-up process. If you do not sign up before the expiration date, you must request a new code. MyChart Access Code:  [email or web address] Expires: 03/21/2012  2:42 PM  4. Enter the last four digits of your Social Security Number (xxxx) and Date of Birth (mm/dd/yyyy) as indicated and click Next. You will be taken to the next sign-up page. 5. Create a MyChart ID. This will be your MyChart login ID and cannot be changed, so think of one that is secure and easy to remember. 6. Create a MyChart password. You can change your password at any time. 7. Enter your Password Reset Question and Answer and click Next. This can be used at a later time if you forget your password.  8. Select your communication preference, and if applicable enter your e-mail address. You will receive e-mail notification when new information is available in MyChart by choosing to receive e-mail notifications and filling in your e-mail. 9. Click Sign In. You can now view your medical record.   Additional Information If you have questions, you can email [email or web address] or call [phone] to talk to our MyChart staff. Remember, MyChart is NOT to be used for urgent needs. For medical emergencies, dial 911.             Kriste Basque R.

## 2012-02-20 NOTE — Patient Instructions (Addendum)
-  We have ordered labs or studies at this visit. It usually takes 1-2 weeks for results and processing. We will contact you with instructions IF your results are abnormal. Normal results will be released to your [facility] in 1-2 weeks. If you have not heard from [place] or can not find your results in [facility] in 2 weeks please contact our office.  -We placed a referral for you as discussed. It usually takes about 1-2 weeks to process and schedule this referral. If you have not heard from [place] regarding this appointment in 2 weeks please contact our office.  Thank you for enrolling in MyChart. Please follow the instructions below to securely access your online medical record. MyChart allows you to send messages to your doctor, view your test results, renew your prescriptions, schedule appointments, and more.  How Do I Sign Up? 1. In your Internet browser, go to [email or web address] 2. Click on the New  User? link in the Sign In box.  3. Enter your MyChart Access Code exactly as it appears below. You will not need to use this code after you have completed the sign-up process. If you do not sign up before the expiration date, you must request a new code. MyChart Access Code: [email or web address] Expires: 03/21/2012  2:42 PM  4. Enter the last four digits of your Social Security Number (xxxx) and Date of Birth (mm/dd/yyyy) as indicated and click Next. You will be taken to the next sign-up page. 5. Create a MyChart ID. This will be your MyChart login ID and cannot be changed, so think of one that is secure and easy to remember. 6. Create a MyChart password. You can change your password at any time. 7. Enter your Password Reset Question and Answer and click Next. This can be used at a later time if you forget your password.  8. Select your communication preference, and if applicable enter your e-mail address. You will receive e-mail notification when new information is available in MyChart by choosing  to receive e-mail notifications and filling in your e-mail. 9. Click Sign In. You can now view your medical record.   Additional Information If you have questions, you can email [email or web address] or call [phone] to talk to our MyChart staff. Remember, MyChart is NOT to be used for urgent needs. For medical emergencies, dial 911.

## 2012-02-21 ENCOUNTER — Telehealth: Payer: Self-pay | Admitting: Family Medicine

## 2012-02-21 DIAGNOSIS — E781 Pure hyperglyceridemia: Secondary | ICD-10-CM

## 2012-02-21 DIAGNOSIS — E785 Hyperlipidemia, unspecified: Secondary | ICD-10-CM

## 2012-02-21 HISTORY — DX: Hyperlipidemia, unspecified: E78.5

## 2012-02-21 NOTE — Telephone Encounter (Signed)
Please let him know cholesterol is higher then several years ago. It will be very important to work on a healthy diet and regular exercise. I advise a follow up appointment in 3-4 months with fasting labs few days prior to that appt to recheck and discuss. Thanks.

## 2012-02-21 NOTE — Telephone Encounter (Signed)
Called and spoke with pt and pt is aware.  

## 2012-06-16 ENCOUNTER — Other Ambulatory Visit: Payer: Self-pay

## 2013-03-03 ENCOUNTER — Ambulatory Visit (INDEPENDENT_AMBULATORY_CARE_PROVIDER_SITE_OTHER): Payer: 59 | Admitting: Family Medicine

## 2013-03-03 ENCOUNTER — Ambulatory Visit: Payer: 59

## 2013-03-03 VITALS — BP 124/70 | HR 91 | Temp 99.0°F | Resp 18 | Ht 70.0 in | Wt 213.0 lb

## 2013-03-03 DIAGNOSIS — R0602 Shortness of breath: Secondary | ICD-10-CM

## 2013-03-03 DIAGNOSIS — R05 Cough: Secondary | ICD-10-CM

## 2013-03-03 DIAGNOSIS — J209 Acute bronchitis, unspecified: Secondary | ICD-10-CM

## 2013-03-03 DIAGNOSIS — R509 Fever, unspecified: Secondary | ICD-10-CM

## 2013-03-03 DIAGNOSIS — R059 Cough, unspecified: Secondary | ICD-10-CM

## 2013-03-03 LAB — POCT CBC
Granulocyte percent: 79.2 %G (ref 37–80)
HCT, POC: 45.4 % (ref 43.5–53.7)
Hemoglobin: 14.5 g/dL (ref 14.1–18.1)
Lymph, poc: 2.1 (ref 0.6–3.4)
MCH, POC: 30 pg (ref 27–31.2)
MCHC: 31.9 g/dL (ref 31.8–35.4)
MCV: 93.8 fL (ref 80–97)
MID (cbc): 1 — AB (ref 0–0.9)
MPV: 9.6 fL (ref 0–99.8)
POC Granulocyte: 11.9 — AB (ref 2–6.9)
POC LYMPH PERCENT: 14.2 %L (ref 10–50)
POC MID %: 6.6 %M (ref 0–12)
Platelet Count, POC: 324 10*3/uL (ref 142–424)
RBC: 4.84 M/uL (ref 4.69–6.13)
RDW, POC: 14.4 %
WBC: 15 10*3/uL — AB (ref 4.6–10.2)

## 2013-03-03 MED ORDER — AZITHROMYCIN 250 MG PO TABS: ORAL_TABLET | ORAL | Status: DC

## 2013-03-03 MED ORDER — HYDROCODONE-HOMATROPINE 5-1.5 MG/5ML PO SYRP: 5.0000 mL | ORAL_SOLUTION | Freq: Three times a day (TID) | ORAL | Status: DC | PRN

## 2013-03-03 MED ORDER — ALBUTEROL SULFATE (2.5 MG/3ML) 0.083% IN NEBU: 2.5000 mg | INHALATION_SOLUTION | Freq: Once | RESPIRATORY_TRACT | Status: DC

## 2013-03-03 NOTE — Progress Notes (Signed)
Subjective:    Patient ID: Timothy Jennings, male    DOB: Aug 23, 1965, 47 y.o.   MRN: [id number]  Fever  Associated symptoms include chest pain, congestion and coughing. Pertinent negatives include no abdominal pain, ear pain, headaches, nausea, sore throat or vomiting.  Cough Associated symptoms include chest pain, chills, a fever and shortness of breath. Pertinent negatives include no ear pain, headaches or sore throat.   47 year old male presents for evaluation of 6 day history of fever, cough, and nasal congestion.  States symptoms started acutely with cough and fever and have progressively gotten worse. States cough is not productive except in the morning. Fever at home has been highest of 100.1.  Complains of SOB and chest pain while coughing and taking deep inspirations.  Has been taking theraflu, aleve, and tylenol which helps with the fever and aches but otherwise has not done anything to help him.  Denies sore throat, ear pain, headache, dizziness, nausea, vomiting, or abdominal pain.  [facility] and was given amoxicillin to take. Diagnosed with allergies and possible ear infection.  States he has taken 2 doses and does not feel any better today.  Does have a hx of seasonal allergies and has an albuterol inhaler at home that he uses as as needed. He has not used it during this illness.   He does continue to be a light smoker, 1 pack every 2-3 days.  Has quit in the past but has recently begun smoking again.     Review of Systems  Constitutional: Positive for fever and chills.  HENT: Positive for congestion. Negative for ear pain, sinus pressure and sore throat.   Respiratory: Positive for cough, chest tightness and shortness of breath.   Cardiovascular: Positive for chest pain.  Gastrointestinal: Negative for nausea, vomiting and abdominal pain.  Neurological: Negative for dizziness and headaches.       Objective:   Physical Exam  Constitutional: He is  oriented to person, place, and time. He appears well-developed and well-nourished.  HENT:  Head: Normocephalic and atraumatic.  Right Ear: Hearing, tympanic membrane, external ear and ear canal normal.  Left Ear: Hearing, tympanic membrane, external ear and ear canal normal.  Mouth/Throat: Uvula is midline, oropharynx is clear and moist and mucous membranes are normal.  Eyes: Conjunctivae are normal.  Neck: Normal range of motion. Neck supple.  Cardiovascular: Normal rate, regular rhythm and normal heart sounds.   Pulmonary/Chest: Effort normal and breath sounds normal.  Lymphadenopathy:    He has no cervical adenopathy.  Neurological: He is alert and oriented to person, place, and time.  Psychiatric: He has a normal mood and affect. His behavior is normal. Judgment and thought content normal.      Results for orders placed in visit on 03/03/13  POCT CBC      Result Value Range   WBC 15.0 (*) 4.6 - 10.2 K/uL   Lymph, poc 2.1  0.6 - 3.4   POC LYMPH PERCENT 14.2  10 - 50 %L   MID (cbc) 1.0 (*) 0 - 0.9   POC MID % 6.6  0 - 12 %M   POC Granulocyte 11.9 (*) 2 - 6.9   Granulocyte percent 79.2  37 - 80 %G   RBC 4.84  4.69 - 6.13 M/uL   Hemoglobin 14.5  14.1 - 18.1 g/dL   HCT, POC 40.9  81.1 - 53.7 %   MCV 93.8  80 - 97 fL  MCH, POC 30.0  27 - 31.2 pg   MCHC 31.9  31.8 - 35.4 g/dL   RDW, POC 91.4     Platelet Count, POC 324  142 - 424 K/uL   MPV 9.6  0 - 99.8 fL   [facility] reading (PRIMARY) by  Dr. Clelia Croft as no acute infiltrate or consolidation. Increased markings throughout.  Peak flow pre nebulizer 400 (target 550)     Assessment & Plan:  Acute bronchitis  Cough - Plan: POCT CBC, DG Chest 2 View  Fever, unspecified - Plan: POCT CBC, DG Chest 2 View  Shortness of breath - Plan: albuterol (PROVENTIL) (2.5 MG/3ML) 0.083% nebulizer solution 2.5 mg  D/C amoxicillin Start Zpack as directed Hycodan cough syrup q8hours prn cough - use sparingly Continue Mucinex as  directed Albuterol inhaler (pt has already) use q4-6hours to help with SOB and wheezing If symptoms persist and patient still febrile in 48 hours, recommend recheck - sooner if worse.

## 2013-03-04 NOTE — Progress Notes (Signed)
Reviewed documentation and xray and agree w/ assessment and plan. Eva Shaw, MD MPH   

## 2013-03-07 ENCOUNTER — Other Ambulatory Visit: Payer: Self-pay

## 2013-05-24 ENCOUNTER — Encounter: Payer: Self-pay | Admitting: Family Medicine

## 2013-05-24 NOTE — Progress Notes (Signed)
[facility] for an allergy consult.  Pt referred for allergy testing.  Pt to continue Nasonex, sudafed, ciprodex otic supsension and fexofenadine hcl.  Sent to scan.

## 2013-12-06 ENCOUNTER — Ambulatory Visit (INDEPENDENT_AMBULATORY_CARE_PROVIDER_SITE_OTHER): Payer: 59 | Admitting: Family Medicine

## 2013-12-06 ENCOUNTER — Other Ambulatory Visit: Payer: Self-pay | Admitting: Family Medicine

## 2013-12-06 ENCOUNTER — Encounter: Payer: Self-pay | Admitting: Family Medicine

## 2013-12-06 VITALS — BP 118/78 | HR 81 | Temp 98.5°F | Ht 70.0 in | Wt 209.0 lb

## 2013-12-06 DIAGNOSIS — R197 Diarrhea, unspecified: Secondary | ICD-10-CM

## 2013-12-06 LAB — COMPREHENSIVE METABOLIC PANEL
ALT: 17 U/L (ref 0–53)
AST: 24 U/L (ref 0–37)
Albumin: 4 g/dL (ref 3.5–5.2)
Alkaline Phosphatase: 80 U/L (ref 39–117)
BILIRUBIN TOTAL: 0.2 mg/dL (ref 0.2–1.2)
BUN: 12 mg/dL (ref 6–23)
CALCIUM: 9.2 mg/dL (ref 8.4–10.5)
CO2: 28 meq/L (ref 19–32)
CREATININE: 1 mg/dL (ref 0.4–1.5)
Chloride: 101 mEq/L (ref 96–112)
GFR: 84.35 mL/min (ref >60.00)
GLUCOSE: 85 mg/dL (ref 70–99)
Potassium: 4 mEq/L (ref 3.5–5.1)
Sodium: 137 mEq/L (ref 135–145)
Total Protein: 7 g/dL (ref 6.0–8.3)

## 2013-12-06 LAB — CBC WITH DIFFERENTIAL/PLATELET
BASOS PCT: 0.4 % (ref 0.0–3.0)
Basophils Absolute: 0.1 10*3/uL (ref 0.0–0.1)
EOS PCT: 2.2 % (ref 0.0–5.0)
Eosinophils Absolute: 0.3 10*3/uL (ref 0.0–0.7)
HEMATOCRIT: 43.5 % (ref 39.0–52.0)
HEMOGLOBIN: 14.4 g/dL (ref 13.0–17.0)
LYMPHS ABS: 1.7 10*3/uL (ref 0.7–4.0)
Lymphocytes Relative: 14.5 % (ref 12.0–46.0)
MCHC: 33.3 g/dL (ref 30.0–36.0)
MCV: 89.4 fl (ref 78.0–100.0)
MONOS PCT: 5.9 % (ref 3.0–12.0)
Monocytes Absolute: 0.7 10*3/uL (ref 0.1–1.0)
NEUTROS ABS: 9.2 10*3/uL — AB (ref 1.4–7.7)
Neutrophils Relative %: 77 % (ref 43.0–77.0)
Platelets: 252 10*3/uL (ref 150.0–400.0)
RBC: 4.86 Mil/uL (ref 4.22–5.81)
RDW: 13.5 % (ref 11.5–15.5)
WBC: 11.9 10*3/uL — AB (ref 4.0–10.5)

## 2013-12-06 NOTE — Progress Notes (Signed)
Pre visit review using our clinic review tool, if applicable. No additional management support is needed unless otherwise documented below in the visit note. 

## 2013-12-06 NOTE — Progress Notes (Signed)
No chief complaint on file.   HPI:  Acute visit for:  1) Diarrhea: -for 6-7 months -symptom: large BM 3-8 times per day, preceded by urge, watery diarrhea at least 1x per day -denies: weight loss, fevers, blood in stool, melena, nausea, vomiting, abd pain, malaise, floating stools -FH: no GI issues -no travel -did have abx before this started - no abx since -dairy hurts him - but he eats cheese every day  ROS: See pertinent positives and negatives per HPI.  Past Medical History  Diagnosis Date  . Otitis media     followed by ENT   . Chicken pox   . Allergy     Past Surgical History  Procedure Laterality Date  . Tympanostomy tube placement    . Neck surgery  2007  . Eye surgery    . Spine surgery    . Vasectomy      Family History  Problem Relation Age of Onset  . Hypertension Paternal Grandmother   . Heart disease Paternal Grandmother   . Hypertension Paternal Grandfather   . Heart disease Paternal Grandfather   . Alcohol abuse      Grandparent  . Hyperlipidemia Father     History   Social History  . Marital Status: Divorced    Spouse Name: N/A    Number of Children: N/A  . Years of Education: N/A   Social History Main Topics  . Smoking status: Light Tobacco Smoker  . Smokeless tobacco: None  . Alcohol Use: 8.4 oz/week    14 Cans of beer per week     Comment: 3-4 per week  . Drug Use: No  . Sexual Activity: None   Other Topics Concern  . None   Social History Narrative  . None    No current outpatient prescriptions on file. Current facility-administered medications:albuterol (PROVENTIL) (2.5 MG/3ML) 0.083% nebulizer solution 2.5 mg, 2.5 mg, Nebulization, Once, Intel CorporationHeather M Marte, PA-C  EXAM:  Filed Vitals:   12/06/13 1337  BP: 118/78  Pulse: 81  Temp: 98.5 F (36.9 C)    Body mass index is 29.99 kg/(m^2).  GENERAL: vitals reviewed and listed above, alert, oriented, appears well hydrated and in no acute distress  HEENT: atraumatic,  conjunttiva clear, no obvious abnormalities on inspection of external nose and ears  NECK: no obvious masses on inspection  LUNGS: clear to auscultation bilaterally, no wheezes, rales or rhonchi, good air movement  CV: HRRR, no peripheral edema  ABD: BS+, soft, NTTP  MS: moves all extremities without noticeable abnormality  PSYCH: pleasant and cooperative, no obvious depression or anxiety  ASSESSMENT AND PLAN:  Discussed the following assessment and plan:  Diarrhea - Plan: C. difficile, PCR, Ova and Parasite Exam, Stool culture, CBC with Differential, CMP, Gliadin IgA+tTG IgA, CANCELED: t-Transglutaminase (tTG) IgG  -we discussed possible serious and likely etiologies, workup and treatment, treatment risks and return precautions with IBS a likely possibility  -after this discussion, Timothy Jennings for labs/stools studies per above, no dairy for 1 month, probiotic and close follow up - GI referral if persists -follow up advised in 1 month for CPE -of course, we advised Timothy Jennings  to return or notify a doctor immediately if symptoms worsen or persist or new concerns arise.  -Patient advised to return or notify a doctor immediately if symptoms worsen or persist or new concerns arise.  Patient Instructions  -no dairy (cheese, milk, yogurt, etc)  -probiotic for 1 month  -follow up in 1 month for CPE  Colin Benton R.

## 2013-12-06 NOTE — Patient Instructions (Addendum)
-  no dairy (cheese, milk, yogurt, etc)  -probiotic for 1 month  -follow up in 1 month for CPE

## 2013-12-09 ENCOUNTER — Telehealth: Payer: Self-pay | Admitting: Family Medicine

## 2013-12-09 NOTE — Telephone Encounter (Signed)
Relevant patient education assigned to patient using Emmi. ° °

## 2013-12-10 ENCOUNTER — Telehealth: Payer: Self-pay | Admitting: *Deleted

## 2013-12-10 NOTE — Telephone Encounter (Signed)
Dr Clent RidgesFry reviewed te Gliadin lab test results from [facility] and stated the pt is negative for celiac disease.  I left a detailed message at the pts cell number with this information.

## 2013-12-13 LAB — CLOSTRIDIUM DIFFICILE BY PCR: CDIFFPCR: NOT DETECTED

## 2013-12-13 LAB — OVA AND PARASITE EXAMINATION: OP: NONE SEEN

## 2013-12-15 LAB — STOOL CULTURE

## 2014-01-28 ENCOUNTER — Ambulatory Visit (INDEPENDENT_AMBULATORY_CARE_PROVIDER_SITE_OTHER): Payer: 59 | Admitting: Family Medicine

## 2014-01-28 ENCOUNTER — Encounter: Payer: Self-pay | Admitting: Family Medicine

## 2014-01-28 VITALS — BP 120/82 | HR 84 | Temp 97.8°F | Ht 70.0 in | Wt 208.0 lb

## 2014-01-28 DIAGNOSIS — R197 Diarrhea, unspecified: Secondary | ICD-10-CM

## 2014-01-28 DIAGNOSIS — E785 Hyperlipidemia, unspecified: Secondary | ICD-10-CM

## 2014-01-28 DIAGNOSIS — R21 Rash and other nonspecific skin eruption: Secondary | ICD-10-CM

## 2014-01-28 DIAGNOSIS — E663 Overweight: Secondary | ICD-10-CM

## 2014-01-28 DIAGNOSIS — Z23 Encounter for immunization: Secondary | ICD-10-CM

## 2014-01-28 DIAGNOSIS — Z0000 Encounter for general adult medical examination without abnormal findings: Secondary | ICD-10-CM

## 2014-01-28 LAB — LIPID PANEL
CHOL/HDL RATIO: 5
Cholesterol: 198 mg/dL (ref 0–200)
HDL: 37 mg/dL — ABNORMAL LOW (ref >39.00)
LDL CALC: 136 mg/dL — AB (ref 0–99)
NONHDL: 161
Triglycerides: 125 mg/dL (ref 0.0–149.0)
VLDL: 25 mg/dL (ref 0.0–40.0)

## 2014-01-28 LAB — BASIC METABOLIC PANEL
BUN: 16 mg/dL (ref 6–23)
CHLORIDE: 104 meq/L (ref 96–112)
CO2: 29 mEq/L (ref 19–32)
CREATININE: 1 mg/dL (ref 0.4–1.5)
Calcium: 9.5 mg/dL (ref 8.4–10.5)
GFR: 86.26 mL/min (ref >60.00)
Glucose, Bld: 89 mg/dL (ref 70–99)
POTASSIUM: 4.8 meq/L (ref 3.5–5.1)
Sodium: 139 mEq/L (ref 135–145)

## 2014-01-28 LAB — HEMOGLOBIN A1C: HEMOGLOBIN A1C: 5.6 % (ref 4.6–6.5)

## 2014-01-28 MED ORDER — TRIAMCINOLONE ACETONIDE 0.1 % EX CREA: 1.0000 "application " | TOPICAL_CREAM | Freq: Two times a day (BID) | CUTANEOUS | Status: DC

## 2014-01-28 NOTE — Progress Notes (Signed)
Pre visit review using our clinic review tool, if applicable. No additional management support is needed unless otherwise documented below in the visit note. 

## 2014-01-28 NOTE — Progress Notes (Signed)
No chief complaint on file.   HPI:  Here for CPE:  -Concerns and/or follow up today:  1)Diarrhea: -c. Diff, O an P, stool cx, TTG, cbc and cmp ok other then very mild elevation in wbc on cbc -advised no dairy, probiotic -reports: resolved completely after stopping energy drinks -denies: abd pain, diarrhea, weight loss, fevers, nausea, vomiting  2)Hx HLD: -reports: never on medications for this -denies: denies hx elevated BP or blood sugar issues  3)SKIN Rash: -itchy bumps on skin on and off for 1 week, on different parts of body, then resolve -has cats, did go on trip lately and was in apartment with fleas -exterminator coming, no lesions today -denies SOB, facial or throat or tongue swelling   -Diet: poor diet, larger portion sizes  -Exercise:  Walks dogs 10-20 minutes daily  -Diabetes and Dyslipidemia Screening: dong fasting labs today  -Hx of HTN: no  -Vaccines: flu shot today  -sexual activity: yes, male partner, no new partners  -wants STI testing, Hep C screening (if born [id number]): no  -FH colon or prstate ca: see FH Last colon cancer screening:  n/a Last prostate ca screening: n/a  -Alcohol, Tobacco, drug use: see social history  Review of Systems - no fevers, unintentional weight loss, vision loss, hearing loss, chest pain, sob, hemoptysis, melena, hematochezia, hematuria, genital discharge, changing or concerning skin lesions, bleeding, bruising, loc, thoughts of self harm or SI  Past Medical History  Diagnosis Date  . Otitis media     followed by ENT   . Chicken pox   . Allergy     Past Surgical History  Procedure Laterality Date  . Tympanostomy tube placement    . Neck surgery  2007  . Eye surgery    . Spine surgery    . Vasectomy      Family History  Problem Relation Age of Onset  . Hypertension Paternal Grandmother   . Heart disease Paternal Grandmother   . Hypertension Paternal Grandfather   . Heart disease Paternal Grandfather    . Alcohol abuse      Grandparent  . Hyperlipidemia Father     History   Social History  . Marital Status: Divorced    Spouse Name: N/A    Number of Children: N/A  . Years of Education: N/A   Social History Main Topics  . Smoking status: Current Every Day Smoker    Types: E-cigarettes  . Smokeless tobacco: None  . Alcohol Use: 8.4 oz/week    14 Cans of beer per week     Comment: 3-4 per week  . Drug Use: No  . Sexual Activity: None   Other Topics Concern  . None   Social History Narrative  . None    Current outpatient prescriptions:DiphenhydrAMINE HCl (BENADRYL ALLERGY PO), Take by mouth as needed., Disp: , Rfl: ;  Pseudoephedrine HCl (SUDAFED PO), Take by mouth., Disp: , Rfl: ;  triamcinolone cream (KENALOG) 0.1 %, Apply 1 application topically 2 (two) times daily., Disp: 30 g, Rfl: 0 Current facility-administered medications:albuterol (PROVENTIL) (2.5 MG/3ML) 0.083% nebulizer solution 2.5 mg, 2.5 mg, Nebulization, Once, [employer] CorporationHeather M Marte, PA-C  EXAM:  Filed Vitals:   01/28/14 0925  BP: 120/82  Pulse: 84  Temp: 97.8 F (36.6 C)  TempSrc: Oral  Height: 5\' 10"  (1.778 m)  Weight: 208 lb (94.348 kg)    Estimated body mass index is 29.84 kg/(m^2) as calculated from the following:   Height as of this encounter: 5\' 10"  (1.778  m).   Weight as of this encounter: 208 lb (94.348 kg). Body mass index is 29.84 kg/(m^2).  GENERAL: vitals reviewed and listed below, alert, oriented, appears well hydrated and in no acute distress  HEENT: head atraumatic, PERRLA, normal appearance of eyes, ears, nose and mouth. moist mucus membranes.  NECK: supple, no masses or lymphadenopathy  LUNGS: clear to auscultation bilaterally, no rales, rhonchi or wheeze  CV: HRRR, no peripheral edema or cyanosis, normal pedal pulses  ABDOMEN: bowel sounds normal, soft, non tender to palpation, no masses, no rebound or guarding  GU: declined  RECTAL: refused  SKIN: few scattered erythematous  healing papules  MS: normal gait, moves all extremities normally  PSYCH: normal affect, pleasant and cooperative  ASSESSMENT AND PLAN:  Discussed the following assessment and plan:  1. Physical exam -Discussed and advised all [place] preventive services health task force level A and B recommendations for age, sex and risks. -FASTING labs, studies and vaccines per orders this encounter - RPR - HIV antibody (with reflex) - Lipid Panel - Basic metabolic panel - Hemoglobin A1c -flu vaccine  2. Other and unspecified hyperlipidemia - Lipid Panel  3. Overweight -Advised at least 150 minutes of exercise per week and a healthy diet low in saturated fats and sweets and consisting of fresh fruits and vegetables, lean meats such as fish and white chicken and whole grains.  4. Diarrhea -resolved  5. Rash and nonspecific skin eruption -likely insect bites -we discussed possible serious and likely etiologies, workup and treatment, treatment risks and return precautions -after this discussion, Brycin opted for exterminator, topical steroid cream prn, derm or allergist if persists or worsens -of course, we advised Kratos  to return or notify a doctor immediately if symptoms worsen or persist or new concerns arise.  Patient advised to return to clinic immediately if symptoms worsen or persist or new concerns.  Kriste Basque R.

## 2014-01-28 NOTE — Addendum Note (Signed)
Addended by: Johnella MoloneyFUNDERBURK, JO A on: 01/28/2014 10:18 AM   Modules accepted: Orders

## 2014-01-29 ENCOUNTER — Telehealth: Payer: Self-pay | Admitting: Family Medicine

## 2014-01-29 LAB — RPR

## 2014-01-29 LAB — HIV ANTIBODY (ROUTINE TESTING W REFLEX): HIV 1&2 Ab, 4th Generation: NONREACTIVE

## 2014-01-29 NOTE — Telephone Encounter (Signed)
emmi emailed °

## 2014-04-29 ENCOUNTER — Encounter: Payer: Self-pay | Admitting: Family Medicine

## 2014-04-29 ENCOUNTER — Ambulatory Visit (INDEPENDENT_AMBULATORY_CARE_PROVIDER_SITE_OTHER): Payer: 59 | Admitting: Family Medicine

## 2014-04-29 VITALS — BP 124/90 | HR 70 | Temp 98.1°F | Ht 70.0 in | Wt 212.1 lb

## 2014-04-29 DIAGNOSIS — J309 Allergic rhinitis, unspecified: Secondary | ICD-10-CM

## 2014-04-29 DIAGNOSIS — E785 Hyperlipidemia, unspecified: Secondary | ICD-10-CM

## 2014-04-29 NOTE — Progress Notes (Signed)
Pre visit review using our clinic review tool, if applicable. No additional management support is needed unless otherwise documented below in the visit note. 

## 2014-04-29 NOTE — Patient Instructions (Signed)
BEFORE YOU LEAVE: -schedule fasting lab appointment at earliest convenience   For Allergies and Sinus congestion: -flonase daily for 1 month -zyrtec or claritin or allegra daily -follow up as needed  We recommend the following healthy lifestyle measures: - eat a healthy diet consisting of lots of vegetables, fruits, beans, nuts, seeds, healthy meats such as white chicken and fish and whole grains.  - avoid fried foods, fast food, processed foods, sodas, red meet and other fattening foods.  - get a least 150 minutes of aerobic exercise per week.

## 2014-04-29 NOTE — Progress Notes (Signed)
HPI:  HLD: -diet and exercise: walking the dogs more to increase exercise, trying to not eat as much red meat and more veggies -denies: CP, SOB - he forgot to fast this morning  Rhinosinusitis: -doing better - started a few weeks ago -some allergies - has sneezing and PND -denies: fevers, chills, tooth pain, worsening, sinus pain, SOB -takes allegra sometimes, nasal saline, benadryl   ROS: See pertinent positives and negatives per HPI.  Past Medical History  Diagnosis Date  . Otitis media     followed by ENT   . Chicken pox   . Allergy     Past Surgical History  Procedure Laterality Date  . Tympanostomy tube placement    . Neck surgery  2007  . Eye surgery    . Spine surgery    . Vasectomy      Family History  Problem Relation Age of Onset  . Hypertension Paternal Grandmother   . Heart disease Paternal Grandmother   . Hypertension Paternal Grandfather   . Heart disease Paternal Grandfather   . Alcohol abuse      Grandparent  . Hyperlipidemia Father     History   Social History  . Marital Status: Divorced    Spouse Name: N/A    Number of Children: N/A  . Years of Education: N/A   Social History Main Topics  . Smoking status: Current Every Day Smoker    Types: E-cigarettes  . Smokeless tobacco: None  . Alcohol Use: 8.4 oz/week    14 Cans of beer per week     Comment: 3-4 per week  . Drug Use: No  . Sexual Activity: None   Other Topics Concern  . None   Social History Narrative    Current outpatient prescriptions: DiphenhydrAMINE HCl (BENADRYL ALLERGY PO), Take by mouth as needed., Disp: , Rfl: ;  Pseudoephedrine HCl (SUDAFED PO), Take by mouth., Disp: , Rfl: ;  triamcinolone cream (KENALOG) 0.1 %, Apply 1 application topically 2 (two) times daily., Disp: 30 g, Rfl: 0 Current facility-administered medications: albuterol (PROVENTIL) (2.5 MG/3ML) 0.083% nebulizer solution 2.5 mg, 2.5 mg, Nebulization, Once, Intel CorporationHeather M Marte, PA-C  EXAM:  Filed  Vitals:   04/29/14 0850  BP: 124/90  Pulse: 70  Temp: 98.1 F (36.7 C)    Body mass index is 30.43 kg/(m^2).  GENERAL: vitals reviewed and listed above, alert, oriented, appears well hydrated and in no acute distress  HEENT: atraumatic, conjunttiva clear, no obvious abnormalities on inspection of external nose and ears  NECK: no obvious masses on inspection  LUNGS: clear to auscultation bilaterally, no wheezes, rales or rhonchi, good air movement  CV: HRRR, no peripheral edema  MS: moves all extremities without noticeable abnormality  PSYCH: pleasant and cooperative, no obvious depression or anxiety  ASSESSMENT AND PLAN:  Discussed the following assessment and plan:  Hyperlipidemia - Plan: Lipid Panel  Allergic rhinitis, unspecified allergic rhinitis type  -Patient advised to return or notify a doctor immediately if symptoms worsen or persist or new concerns arise.  Patient Instructions  BEFORE YOU LEAVE: -schedule fasting lab appointment at earliest convenience   For Allergies and Sinus congestion: -flonase daily for 1 month -zyrtec or claritin or allegra daily -follow up as needed  We recommend the following healthy lifestyle measures: - eat a healthy diet consisting of lots of vegetables, fruits, beans, nuts, seeds, healthy meats such as white chicken and fish and whole grains.  - avoid fried foods, fast food, processed foods, sodas, red  meet and other fattening foods.  - get a least 150 minutes of aerobic exercise per week.       Kriste BasqueKIM, Veyda Kaufman R.

## 2014-04-30 ENCOUNTER — Other Ambulatory Visit (INDEPENDENT_AMBULATORY_CARE_PROVIDER_SITE_OTHER): Payer: 59

## 2014-04-30 DIAGNOSIS — E785 Hyperlipidemia, unspecified: Secondary | ICD-10-CM

## 2014-04-30 LAB — LIPID PANEL
CHOL/HDL RATIO: 6
Cholesterol: 215 mg/dL — ABNORMAL HIGH (ref 0–200)
HDL: 37.5 mg/dL — AB (ref >39.00)
LDL Cholesterol: 153 mg/dL — ABNORMAL HIGH (ref 0–99)
NONHDL: 177.5
Triglycerides: 125 mg/dL (ref 0.0–149.0)
VLDL: 25 mg/dL (ref 0.0–40.0)

## 2014-05-01 ENCOUNTER — Other Ambulatory Visit: Payer: Self-pay | Admitting: *Deleted

## 2014-05-01 MED ORDER — PRAVASTATIN SODIUM 40 MG PO TABS: 40.0000 mg | ORAL_TABLET | Freq: Every day | ORAL | Status: DC

## 2014-07-31 ENCOUNTER — Encounter: Payer: Self-pay | Admitting: Family Medicine

## 2014-07-31 ENCOUNTER — Ambulatory Visit (INDEPENDENT_AMBULATORY_CARE_PROVIDER_SITE_OTHER): Payer: 59 | Admitting: Family Medicine

## 2014-07-31 VITALS — BP 120/80 | HR 78 | Temp 98.1°F | Ht 70.0 in | Wt 215.1 lb

## 2014-07-31 DIAGNOSIS — J302 Other seasonal allergic rhinitis: Secondary | ICD-10-CM | POA: Diagnosis not present

## 2014-07-31 DIAGNOSIS — J309 Allergic rhinitis, unspecified: Secondary | ICD-10-CM | POA: Diagnosis not present

## 2014-07-31 DIAGNOSIS — E785 Hyperlipidemia, unspecified: Secondary | ICD-10-CM | POA: Diagnosis not present

## 2014-07-31 LAB — LIPID PANEL
CHOL/HDL RATIO: 5
CHOLESTEROL: 189 mg/dL (ref 0–200)
HDL: 39.1 mg/dL (ref >39.00)
LDL CALC: 129 mg/dL — AB (ref 0–99)
NonHDL: 149.9
TRIGLYCERIDES: 106 mg/dL (ref 0.0–149.0)
VLDL: 21.2 mg/dL (ref 0.0–40.0)

## 2014-07-31 NOTE — Progress Notes (Signed)
Pre visit review using our clinic review tool, if applicable. No additional management support is needed unless otherwise documented below in the visit note. 

## 2014-07-31 NOTE — Progress Notes (Signed)
HPI:  Follow up:  HLD: -diet and exercise: not exercising, trying to not eat as much red meat and more veggies -pravastatin 40 mg started 04/2014 -denies: CP, SOB, swelling, cog changes, leg cramps  Elevated BP: -mild, diastolic last visit -great today -denies: CP, SOB, DOE  Rhinosinusitis: -some allergies - has sneezing and PND -denies: fevers, chills, tooth pain, worsening, sinus pain, SOB -takes allegra sometimes, nasal saline, benadryl -not using the flonase  ROS: See pertinent positives and negatives per HPI.  Past Medical History  Diagnosis Date  . Otitis media     followed by ENT   . Chicken pox   . Allergy     Past Surgical History  Procedure Laterality Date  . Tympanostomy tube placement    . Neck surgery  2007  . Eye surgery    . Spine surgery    . Vasectomy      Family History  Problem Relation Age of Onset  . Hypertension Paternal Grandmother   . Heart disease Paternal Grandmother   . Hypertension Paternal Grandfather   . Heart disease Paternal Grandfather   . Alcohol abuse      Grandparent  . Hyperlipidemia Father     History   Social History  . Marital Status: Divorced    Spouse Name: N/A  . Number of Children: N/A  . Years of Education: N/A   Social History Main Topics  . Smoking status: Former Smoker    Types: E-cigarettes  . Smokeless tobacco: Not on file  . Alcohol Use: 8.4 oz/week    14 Cans of beer per week     Comment: 3-4 per week  . Drug Use: No  . Sexual Activity: Not on file   Other Topics Concern  . None   Social History Narrative     Current outpatient prescriptions:  .  DiphenhydrAMINE HCl (BENADRYL ALLERGY PO), Take by mouth as needed., Disp: , Rfl:  .  fexofenadine (ALLEGRA) 180 MG tablet, Take 180 mg by mouth daily., Disp: , Rfl:  .  pravastatin (PRAVACHOL) 40 MG tablet, Take 1 tablet (40 mg total) by mouth daily., Disp: 90 tablet, Rfl: 1 .  Pseudoephedrine HCl (SUDAFED PO), Take by mouth., Disp: , Rfl:  .   triamcinolone cream (KENALOG) 0.1 %, Apply 1 application topically 2 (two) times daily., Disp: 30 g, Rfl: 0  Current facility-administered medications:  .  albuterol (PROVENTIL) (2.5 MG/3ML) 0.083% nebulizer solution 2.5 mg, 2.5 mg, Nebulization, Once, [employer] CorporationHeather M Marte, PA-C  EXAM:  Filed Vitals:   07/31/14 0849  BP: 120/80  Pulse: 78  Temp: 98.1 F (36.7 C)    Body mass index is 30.86 kg/(m^2).  GENERAL: vitals reviewed and listed above, alert, oriented, appears well hydrated and in no acute distress  HEENT: atraumatic, conjunttiva clear, no obvious abnormalities on inspection of external nose and ears  NECK: no obvious masses on inspection  LUNGS: clear to auscultation bilaterally, no wheezes, rales or rhonchi, good air movement  CV: HRRR, no peripheral edema  MS: moves all extremities without noticeable abnormality  PSYCH: pleasant and cooperative, no obvious depression or anxiety  ASSESSMENT AND PLAN:  Discussed the following assessment and plan:  Hyperlipidemia - Plan: Lipid Panel -FASTING lipids today -lifestyle recs  Seasonal allergies -start flonase -cont antihistamine  Elevated Blood Pressure: -resolved  -Patient advised to return or notify a doctor immediately if symptoms worsen or persist or new concerns arise.  There are no Patient Instructions on file for this visit.  Colin Benton R.

## 2014-10-29 ENCOUNTER — Ambulatory Visit: Payer: 59 | Admitting: Family Medicine

## 2015-01-30 ENCOUNTER — Ambulatory Visit (INDEPENDENT_AMBULATORY_CARE_PROVIDER_SITE_OTHER): Payer: Managed Care, Other (non HMO) | Admitting: Family Medicine

## 2015-01-30 ENCOUNTER — Encounter: Payer: Self-pay | Admitting: Family Medicine

## 2015-01-30 VITALS — BP 120/84 | HR 77 | Temp 98.2°F | Ht 70.0 in | Wt 218.6 lb

## 2015-01-30 DIAGNOSIS — E669 Obesity, unspecified: Secondary | ICD-10-CM | POA: Diagnosis not present

## 2015-01-30 DIAGNOSIS — J302 Other seasonal allergic rhinitis: Secondary | ICD-10-CM | POA: Insufficient documentation

## 2015-01-30 DIAGNOSIS — E785 Hyperlipidemia, unspecified: Secondary | ICD-10-CM

## 2015-01-30 MED ORDER — PRAVASTATIN SODIUM 40 MG PO TABS: 40.0000 mg | ORAL_TABLET | Freq: Every day | ORAL | Status: DC

## 2015-01-30 NOTE — Progress Notes (Signed)
Pre visit review using our clinic review tool, if applicable. No additional management support is needed unless otherwise documented below in the visit note. 

## 2015-01-30 NOTE — Patient Instructions (Signed)
BEFORE YOU LEAVE: -schedule physical exam in about 3-5 months - come fasting to this appointment  Restart and continue your cholesterol medication  We recommend the following healthy lifestyle measures: - eat a healthy whole foods diet consisting of regular small meals composed of vegetables, fruits, beans, nuts, seeds, healthy meats such as white chicken and fish and whole grains.  - avoid sweets, white starchy foods, fried foods, fast food, processed foods, sodas, red meet and other fattening foods.  - get a least 150-300 minutes of aerobic exercise per week.

## 2015-01-30 NOTE — Progress Notes (Signed)
HPI:  HLD/Obesity: -diet and exercise: not exercising, trying to not eat as much red meat and more veggies -pravastatin 40 mg started 04/2014 - he had change in job and insurance and has not taken in 3 months -denies: CP, SOB, swelling, cog changes, leg cramps   ROS: See pertinent positives and negatives per HPI.  Past Medical History  Diagnosis Date  . Otitis media     followed by ENT   . Chicken pox   . Allergy   . Hyperlipidemia 02/21/2012    Past Surgical History  Procedure Laterality Date  . Tympanostomy tube placement    . Neck surgery  2007  . Eye surgery    . Spine surgery    . Vasectomy      Family History  Problem Relation Age of Onset  . Hypertension Paternal Grandmother   . Heart disease Paternal Grandmother   . Hypertension Paternal Grandfather   . Heart disease Paternal Grandfather   . Alcohol abuse      Grandparent  . Hyperlipidemia Father     Social History   Social History  . Marital Status: Divorced    Spouse Name: N/A  . Number of Children: N/A  . Years of Education: N/A   Social History Main Topics  . Smoking status: Former Smoker    Types: E-cigarettes  . Smokeless tobacco: None  . Alcohol Use: 8.4 oz/week    14 Cans of beer per week     Comment: 3-4 per week  . Drug Use: No  . Sexual Activity: Not Asked   Other Topics Concern  . None   Social History Narrative     Current outpatient prescriptions:  .  DiphenhydrAMINE HCl (BENADRYL ALLERGY PO), Take by mouth as needed., Disp: , Rfl:  .  Pseudoephedrine HCl (SUDAFED PO), Take by mouth., Disp: , Rfl:  .  pravastatin (PRAVACHOL) 40 MG tablet, Take 1 tablet (40 mg total) by mouth daily., Disp: 90 tablet, Rfl: 1  Current facility-administered medications:  .  albuterol (PROVENTIL) (2.5 MG/3ML) 0.083% nebulizer solution 2.5 mg, 2.5 mg, Nebulization, Once, [employer], PA-C  EXAM:  Filed Vitals:   01/30/15 0845  BP: 120/84  Pulse: 77  Temp: 98.2 F (36.8 C)    Body  mass index is 31.37 kg/(m^2).  GENERAL: vitals reviewed and listed above, alert, oriented, appears well hydrated and in no acute distress  HEENT: atraumatic, conjunttiva clear, no obvious abnormalities on inspection of external nose and ears  NECK: no obvious masses on inspection  LUNGS: clear to auscultation bilaterally, no wheezes, rales or rhonchi, good air movement  CV: HRRR, no peripheral edema  MS: moves all extremities without noticeable abnormality  PSYCH: pleasant and cooperative, no obvious depression or anxiety  ASSESSMENT AND PLAN:  Discussed the following assessment and plan:  Obesity  Hyperlipidemia  -restart statin -lifestyle recs -check labs at physical -Patient advised to return or notify a doctor immediately if symptoms worsen or persist or new concerns arise.  Patient Instructions  BEFORE YOU LEAVE: -schedule physical exam in about 3-5 months - come fasting to this appointment  Restart and continue your cholesterol medication  We recommend the following healthy lifestyle measures: - eat a healthy whole foods diet consisting of regular small meals composed of vegetables, fruits, beans, nuts, seeds, healthy meats such as white chicken and fish and whole grains.  - avoid sweets, white starchy foods, fried foods, fast food, processed foods, sodas, red meet and other fattening foods.  -  get a least 150-300 minutes of aerobic exercise per week.       Colin Benton R.

## 2015-05-01 ENCOUNTER — Encounter: Payer: Managed Care, Other (non HMO) | Admitting: Family Medicine

## 2015-05-01 ENCOUNTER — Encounter: Payer: Self-pay | Admitting: Family Medicine

## 2015-05-01 ENCOUNTER — Ambulatory Visit (INDEPENDENT_AMBULATORY_CARE_PROVIDER_SITE_OTHER): Payer: Managed Care, Other (non HMO) | Admitting: Family Medicine

## 2015-05-01 VITALS — BP 130/84 | HR 85 | Temp 98.1°F | Ht 69.25 in | Wt 219.9 lb

## 2015-05-01 DIAGNOSIS — E785 Hyperlipidemia, unspecified: Secondary | ICD-10-CM | POA: Diagnosis not present

## 2015-05-01 DIAGNOSIS — Z0000 Encounter for general adult medical examination without abnormal findings: Secondary | ICD-10-CM | POA: Diagnosis not present

## 2015-05-01 DIAGNOSIS — Z23 Encounter for immunization: Secondary | ICD-10-CM | POA: Diagnosis not present

## 2015-05-01 LAB — HEMOGLOBIN A1C: HEMOGLOBIN A1C: 5.6 % (ref 4.6–6.5)

## 2015-05-01 LAB — LIPID PANEL
CHOLESTEROL: 197 mg/dL (ref 0–200)
HDL: 34.4 mg/dL — AB (ref >39.00)
LDL Cholesterol: 130 mg/dL — ABNORMAL HIGH (ref 0–99)
NonHDL: 162.64
Total CHOL/HDL Ratio: 6
Triglycerides: 162 mg/dL — ABNORMAL HIGH (ref 0.0–149.0)
VLDL: 32.4 mg/dL (ref 0.0–40.0)

## 2015-05-01 NOTE — Patient Instructions (Signed)
BEFORE YOU LEAVE: -flu vaccine -labs  -We have ordered labs or studies at this visit. It can take up to 1-2 weeks for results and processing. We will contact you with instructions IF your results are abnormal. Normal results will be released to your [facility]. If you have not heard from us or can not find your results in [facility] in 2 weeks please contact our office.  We recommend the following healthy lifestyle measures: - eat a healthy whole foods diet consisting of regular small meals composed of vegetables, fruits, beans, nuts, seeds, healthy meats such as white chicken and fish and whole grains.  - avoid sweets, white starchy foods, fried foods, fast food, processed foods, sodas, red meet and other fattening foods.  - get a least 150-300 minutes of aerobic exercise per week.

## 2015-05-01 NOTE — Progress Notes (Signed)
Pre visit review using our clinic review tool, if applicable. No additional management support is needed unless otherwise documented below in the visit note. 

## 2015-05-01 NOTE — Progress Notes (Signed)
HPI:  Here for CPE:  -Concerns and/or follow up today:  HLD/Obesity: -diet and exercise: not exercising but works in [employer] so is very active at work, trying to not eat as much red meat and more veggies -pravastatin 40 mg  -denies: CP, SOB, swelling, cog changes, leg cramps  -Diabetes and Dyslipidemia Screening: FASTINg for labs today  -Hx of HTN: no  -Vaccines: flu shot today  -wants STI testing, Hep C screening (if born 25-1963): wants HIV and RPR testing only  -FH colon or prstate ca: see FH Last colon cancer screening: n/a Last prostate ca screening: n/a  -Alcohol, Tobacco, drug use: see social history  Review of Systems - no fevers, unintentional weight loss, vision loss, hearing loss, chest pain, sob, hemoptysis, melena, hematochezia, hematuria, genital discharge, changing or concerning skin lesions, bleeding, bruising, loc, thoughts of self harm or SI  Past Medical History  Diagnosis Date  . Otitis media     followed by ENT   . Chicken pox   . Allergy   . Hyperlipidemia 02/21/2012    Past Surgical History  Procedure Laterality Date  . Tympanostomy tube placement    . Neck surgery  2007  . Eye surgery    . Spine surgery    . Vasectomy      Family History  Problem Relation Age of Onset  . Hypertension Paternal Grandmother   . Heart disease Paternal Grandmother   . Hypertension Paternal Grandfather   . Heart disease Paternal Grandfather   . Alcohol abuse      Grandparent  . Hyperlipidemia Father     Social History   Social History  . Marital Status: Divorced    Spouse Name: N/A  . Number of Children: N/A  . Years of Education: N/A   Social History Main Topics  . Smoking status: Former Smoker    Types: E-cigarettes  . Smokeless tobacco: None  . Alcohol Use: 8.4 oz/week    14 Cans of beer per week     Comment: 3-4 per week  . Drug Use: No  . Sexual Activity: Not Asked   Other Topics Concern  . None   Social History Narrative      Current outpatient prescriptions:  .  DiphenhydrAMINE HCl (BENADRYL ALLERGY PO), Take by mouth as needed., Disp: , Rfl:  .  loratadine (CLARITIN) 10 MG tablet, Take 10 mg by mouth daily., Disp: , Rfl:  .  pravastatin (PRAVACHOL) 40 MG tablet, Take 1 tablet (40 mg total) by mouth daily., Disp: 90 tablet, Rfl: 1 .  Pseudoephedrine HCl (SUDAFED PO), Take by mouth., Disp: , Rfl:   Current facility-administered medications:  .  albuterol (PROVENTIL) (2.5 MG/3ML) 0.083% nebulizer solution 2.5 mg, 2.5 mg, Nebulization, Once, [employer], PA-C  EXAM:  Filed Vitals:   05/01/15 0829  BP: 130/84  Pulse: 85  Temp: 98.1 F (36.7 C)  TempSrc: Oral  Height: 5' 9.25" (1.759 m)  Weight: 219 lb 14.4 oz (99.746 kg)    Estimated body mass index is 32.24 kg/(m^2) as calculated from the following:   Height as of this encounter: 5' 9.25" (1.759 m).   Weight as of this encounter: 219 lb 14.4 oz (99.746 kg).  GENERAL: vitals reviewed and listed below, alert, oriented, appears well hydrated and in no acute distress  HEENT: head atraumatic, PERRLA, normal appearance of eyes, ears, nose and mouth. moist mucus membranes.  NECK: supple, no masses or lymphadenopathy  LUNGS: clear to auscultation bilaterally, no rales, rhonchi  or wheeze  CV: HRRR, no peripheral edema or cyanosis, normal pedal pulses  ABDOMEN: bowel sounds normal, soft, non tender to palpation, no masses, no rebound or guarding  GU: declined  SKIN: no rash or abnormal lesions - declined full skin exam  MS: normal gait, moves all extremities normally  NEURO: CN II-XII grossly intact, normal muscle strength and sensation to light touch on extremities  PSYCH: normal affect, pleasant and cooperative  ASSESSMENT AND PLAN:  Discussed the following assessment and plan:  Visit for preventive health examination - Plan: Hemoglobin A1c, HIV antibody (with reflex), RPR  Hyperlipemia - Plan: Lipid Panel  -Discussed and advised  all [place] preventive services health task force level A and B recommendations for age, sex and risks.  -Advised at least 150 minutes of exercise per week and a healthy diet low in saturated fats and sweets and consisting of fresh fruits and vegetables, lean meats such as fish and white chicken and whole grains.  -FASTING labs, studies and vaccines per orders this encounter   Patient advised to return to clinic immediately if symptoms worsen or persist or new concerns.  Patient Instructions  BEFORE YOU LEAVE: -flu vaccine -labs  -We have ordered labs or studies at this visit. It can take up to 1-2 weeks for results and processing. We will contact you with instructions IF your results are abnormal. Normal results will be released to your [facility]. If you have not heard from us or can not find your results in [facility] in 2 weeks please contact our office.  We recommend the following healthy lifestyle measures: - eat a healthy whole foods diet consisting of regular small meals composed of vegetables, fruits, beans, nuts, seeds, healthy meats such as white chicken and fish and whole grains.  - avoid sweets, white starchy foods, fried foods, fast food, processed foods, sodas, red meet and other fattening foods.  - get a least 150-300 minutes of aerobic exercise per week.           No Follow-up on file.   Kriste BasqueKIM, Autumm Hattery R.

## 2015-05-02 LAB — RPR

## 2015-05-02 LAB — HIV ANTIBODY (ROUTINE TESTING W REFLEX): HIV: NONREACTIVE

## 2015-06-15 ENCOUNTER — Encounter: Payer: Self-pay | Admitting: Family Medicine

## 2015-06-15 ENCOUNTER — Ambulatory Visit (INDEPENDENT_AMBULATORY_CARE_PROVIDER_SITE_OTHER): Payer: Managed Care, Other (non HMO) | Admitting: Family Medicine

## 2015-06-15 VITALS — BP 138/92 | HR 89 | Temp 98.2°F | Ht 69.25 in | Wt 224.2 lb

## 2015-06-15 DIAGNOSIS — M6588 Other synovitis and tenosynovitis, other site: Secondary | ICD-10-CM

## 2015-06-15 DIAGNOSIS — R030 Elevated blood-pressure reading, without diagnosis of hypertension: Secondary | ICD-10-CM | POA: Diagnosis not present

## 2015-06-15 DIAGNOSIS — IMO0001 Reserved for inherently not codable concepts without codable children: Secondary | ICD-10-CM

## 2015-06-15 DIAGNOSIS — M65949 Unspecified synovitis and tenosynovitis, unspecified hand: Secondary | ICD-10-CM

## 2015-06-15 DIAGNOSIS — J0100 Acute maxillary sinusitis, unspecified: Secondary | ICD-10-CM

## 2015-06-15 DIAGNOSIS — M659 Synovitis and tenosynovitis, unspecified: Secondary | ICD-10-CM

## 2015-06-15 MED ORDER — AMOXICILLIN-POT CLAVULANATE 875-125 MG PO TABS: 1.0000 | ORAL_TABLET | Freq: Two times a day (BID) | ORAL | Status: DC

## 2015-06-15 NOTE — Patient Instructions (Signed)
BEFORE YOU LEAVE: -recheck blood pressure - schedule follow up in 1 month if remains elevated  Take the antibiotic as instructed for the sinus infection and follow up if any worsening or persistent symptoms.  We placed a referral for you as discussed for the finger issues. It usually takes about 1-2 weeks to process and schedule this referral. If you have not heard from [place] regarding this appointment in 2 weeks please contact our office.

## 2015-06-15 NOTE — Progress Notes (Signed)
HPI:  Acute visit for:  ? Sinusitis: -started: 2-3 weeks ago but acutely worse the last 3 days -symptoms:nasal congestion, sore throat, cough, PND - now with L maxillary pain that is worse with leaning forward and R upper tooth pain  -denies:fever, SOB, NVD -has tried: several OTC cold medications -sick contacts/travel/risks: denies flu exposure  R thumb pain: -for several months -this finger and his pinky finger catch at times -denies wekaness, numbness, injury  Elevated Blood pressure -on arrival  ROS: See pertinent positives and negatives per HPI.  Past Medical History  Diagnosis Date  . Otitis media     followed by ENT   . Chicken pox   . Allergy   . Hyperlipidemia 02/21/2012    Past Surgical History  Procedure Laterality Date  . Tympanostomy tube placement    . Neck surgery  2007  . Eye surgery    . Spine surgery    . Vasectomy      Family History  Problem Relation Age of Onset  . Hypertension Paternal Grandmother   . Heart disease Paternal Grandmother   . Hypertension Paternal Grandfather   . Heart disease Paternal Grandfather   . Alcohol abuse      Grandparent  . Hyperlipidemia Father     Social History   Social History  . Marital Status: Divorced    Spouse Name: N/A  . Number of Children: N/A  . Years of Education: N/A   Social History Main Topics  . Smoking status: Former Smoker    Types: E-cigarettes  . Smokeless tobacco: None  . Alcohol Use: 8.4 oz/week    14 Cans of beer per week     Comment: 3-4 per week  . Drug Use: No  . Sexual Activity: Not Asked   Other Topics Concern  . None   Social History Narrative     Current outpatient prescriptions:  .  DiphenhydrAMINE HCl (BENADRYL ALLERGY PO), Take by mouth as needed., Disp: , Rfl:  .  GuaiFENesin (MUCINEX PO), Take by mouth as needed., Disp: , Rfl:  .  loratadine (CLARITIN) 10 MG tablet, Take 10 mg by mouth daily., Disp: , Rfl:  .  pravastatin (PRAVACHOL) 40 MG tablet, Take  1 tablet (40 mg total) by mouth daily., Disp: 90 tablet, Rfl: 1 .  Pseudoephedrine HCl (SUDAFED PO), Take by mouth., Disp: , Rfl:  .  amoxicillin-clavulanate (AUGMENTIN) 875-125 MG tablet, Take 1 tablet by mouth 2 (two) times daily., Disp: 20 tablet, Rfl: 0  Current facility-administered medications:  .  albuterol (PROVENTIL) (2.5 MG/3ML) 0.083% nebulizer solution 2.5 mg, 2.5 mg, Nebulization, Once, [employer], PA-C  EXAM:  Filed Vitals:   06/15/15 1620  BP: 138/90  Pulse: 89  Temp: 98.2 F (36.8 C)    Body mass index is 32.87 kg/(m^2).  GENERAL: vitals reviewed and listed above, alert, oriented, appears well hydrated and in no acute distress  HEENT: atraumatic, conjunttiva clear, no obvious abnormalities on inspection of external nose and ears, normal appearance of ear canals and TMs, thick nasal congestion, mild post oropharyngeal erythema with PND, no tonsillar edema or exudate, no sinus TTP  NECK: no obvious masses on inspection  LUNGS: clear to auscultation bilaterally, no wheezes, rales or rhonchi, good air movement  CV: HRRR, no peripheral edema  MS: moves all extremities without noticeable abnormality - catching and locking of R thumb, no weakness or loss of sensation to light touch, no sig TTP on exam, normal cap refill  PSYCH:  pleasant and cooperative, no obvious depression or anxiety  ASSESSMENT AND PLAN:  Discussed the following assessment and plan:  Acute maxillary sinusitis, recurrence not specified -bacterial sinusitis likely, tx with appropriate abx after discussion risks  Tenosynovitis of finger - Plan: Ambulatory referral to Sports Medicine -discussed tx options, he opted to see sports medicine and suspect injections will be helpful  Elevated blood pressure -may be due to OTC medications -recheck in 1 month  -of course, we advised to return or notify a doctor immediately if symptoms worsen or persist or new concerns arise.    Patient  Instructions  BEFORE YOU LEAVE: -recheck blood pressure - schedule follow up in 1 month if remains elevated  Take the antibiotic as instructed for the sinus infection and follow up if any worsening or persistent symptoms.  We placed a referral for you as discussed for the finger issues. It usually takes about 1-2 weeks to process and schedule this referral. If you have not heard from [place] regarding this appointment in 2 weeks please contact our office.      Kriste Basque R.

## 2015-06-15 NOTE — Progress Notes (Signed)
Pre visit review using our clinic review tool, if applicable. No additional management support is needed unless otherwise documented below in the visit note. 

## 2015-07-01 ENCOUNTER — Encounter: Payer: Self-pay | Admitting: Sports Medicine

## 2015-07-01 ENCOUNTER — Ambulatory Visit (INDEPENDENT_AMBULATORY_CARE_PROVIDER_SITE_OTHER): Payer: Managed Care, Other (non HMO) | Admitting: Sports Medicine

## 2015-07-01 VITALS — BP 130/83 | Ht 70.0 in | Wt 213.0 lb

## 2015-07-01 DIAGNOSIS — M6530 Trigger finger, unspecified finger: Secondary | ICD-10-CM

## 2015-07-01 DIAGNOSIS — M779 Enthesopathy, unspecified: Secondary | ICD-10-CM | POA: Diagnosis not present

## 2015-07-01 DIAGNOSIS — M19041 Primary osteoarthritis, right hand: Secondary | ICD-10-CM

## 2015-07-01 DIAGNOSIS — M1811 Unilateral primary osteoarthritis of first carpometacarpal joint, right hand: Secondary | ICD-10-CM

## 2015-07-01 HISTORY — DX: Unilateral primary osteoarthritis of first carpometacarpal joint, right hand: M18.11

## 2015-07-01 HISTORY — DX: Trigger finger, unspecified finger: M65.30

## 2015-07-01 MED ORDER — MELOXICAM 15 MG PO TABS: 15.0000 mg | ORAL_TABLET | Freq: Every day | ORAL | Status: DC

## 2015-07-01 NOTE — Assessment & Plan Note (Signed)
Meloxicam Topical arnica gel Conservative care

## 2015-07-01 NOTE — Patient Instructions (Signed)
You appear to have a bone spur on your right thumb bone.  You have a thickened tendon here as a result.  You should invest in padded work gloves.  You should purchase Arnica gel and apply to affected areas 4 to 5 times daily.  You are being prescribed Mobic to take once every day.  You should do the hand exercises in warm water each night that Dr Darrick Penna discussed with you.  If no improvement in 4 weeks, please call office for appointment.  Trigger Finger Trigger finger (digital tendinitis and stenosing tenosynovitis) is a common disorder that causes an often painful catching of the fingers or thumb. It occurs as a clicking, snapping, or locking of a finger in the palm of the hand. This is caused by a problem with the tendons that flex or bend the fingers sliding smoothly through their sheaths. The condition may occur in any finger or a couple fingers at the same time.  The finger may lock with the finger curled or suddenly straighten out with a snap. This is more common in patients with rheumatoid arthritis and diabetes. Left untreated, the condition may get worse to the point where the finger becomes locked in flexion, like making a fist, or less commonly locked with the finger straightened out. CAUSES   Inflammation and scarring that lead to swelling around the tendon sheath.  Repeated or forceful movements.  Rheumatoid arthritis, an autoimmune disease that affects joints.  Gout.  Diabetes mellitus. SIGNS AND SYMPTOMS  Soreness and swelling of your finger.  A painful clicking or snapping as you bend and straighten your finger. DIAGNOSIS  Your health care provider will do a physical exam of your finger to diagnose trigger finger. TREATMENT   Splinting for 6-8 weeks may be helpful.  Nonsteroidal anti-inflammatory medicines (NSAIDs) can help to relieve the pain and inflammation.  Cortisone injections, along with splinting, may speed up recovery. Several injections may be required.  Cortisone may give relief after one injection.  Surgery is another treatment that may be used if conservative treatments do not work. Surgery can be minor, without incisions (a cut does not have to be made), and can be done with a needle through the skin.  Other surgical choices involve an open procedure in which the surgeon opens the hand through a small incision and cuts the pulley so the tendon can again slide smoothly. Your hand will still work fine. HOME CARE INSTRUCTIONS  Apply ice to the injured area, twice per day:  Put ice in a plastic bag.  Place a towel between your skin and the bag.  Leave the ice on for 20 minutes, 3-4 times a day.  Rest your hand often. MAKE SURE YOU:   Understand these instructions.  Will watch your condition.  Will get help right away if you are not doing well or get worse.   This information is not intended to replace advice given to you by your health care provider. Make sure you discuss any questions you have with your health care provider.   Document Released: 02/06/2004 Document Revised: 12/19/2012 Document Reviewed: 09/18/2012 Elsevier Interactive Patient Education [employer]

## 2015-07-01 NOTE — Progress Notes (Signed)
    Subjective: CC: Right thumb, pinky finger pain HPI: Timothy Jennings is a 50 y.o. male presenting to clinic today for new patient visit. Concerns today include:  Patient reports that he has had a 3 week history of Right thumb pain and clicking.  He notes that it is difficult to extend his thumb.  He reports that the pain in his thumb is a dull pain that is almost pulling in nature.  He also notes that he has Right pinky locking that is painful when he has to manually unlock it.  He denies injury, numbness/ tingling.  He does endorse some weakness of his right hand.  He has tried stretches for his hand which have not helped.  He occasionally takes an Aleve, with minimal improvement in symptoms.  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  Social History Reviewed: Teaching laboratory technician.  ROS: Per HPI and No hx of general joint swelling; no diabetic sxs; no numbness  Objective: Office vital signs reviewed. BP 130/83 mmHg  Ht  (1.778 m)  Wt 213 lb (96.616 kg)  BMI 30.56 kg/m2  Physical Examination:  General: Awake, alert, well nourished, No acute distress MSK: bilateral hands with thickened calous formation on bilateral palmar aspects  Right hand: with evidence of osteoarthritis in the MCP, CMP appears normal.  No joint erythema.  +TTP along thenar eminence.  +finkelstein's test.  Right pinky with palpable thickening of the flexor tendon but no distinct nodule.  Hand grip slightly decreased on R compared to L.   Skin: dry, intact, no rashes or lesions Neuro: light touch sensation in tact bilateral hands.  [place] RT Hand Spurring of dorsum of MCP 1.  Significant thickening of flexor hallucis where this crosses MCP1.  Note some hypoechoic change within the tendon. 5th flexor tendon has thickened nodule and shows peritenon thickening as well  Assessment/ Plan: 50 y.o. male   1. Bone spur of the Right proximal metacarpal bone of the thumb. Visualized  on bedside ultrasound.  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 - Patient to wear padded gloves at work - Mobic  qd. Take with food - arnica gel recommended 4-5 times daily. - Hand exercises discussed to perform in warm water daily.  2. Trigger finger, acquired. Right pinky finger. - meloxicam (MOBIC) 15 MG tablet; Take 1 tablet (15 mg total) by mouth daily.  Dispense: 30 tablet; Refill: 2 - Consider injection if no improvement - Return in 4 weeks if no improvement.  Raliegh Ip, DO PGY-2, [facility]  Evaluated and examined with Dr Nadine Counts.  KBFields, MD

## 2015-07-01 NOTE — Assessment & Plan Note (Signed)
See OV plan with conservative care

## 2015-07-13 ENCOUNTER — Ambulatory Visit (INDEPENDENT_AMBULATORY_CARE_PROVIDER_SITE_OTHER): Payer: Managed Care, Other (non HMO) | Admitting: Family Medicine

## 2015-07-13 DIAGNOSIS — R69 Illness, unspecified: Secondary | ICD-10-CM

## 2015-07-13 NOTE — Progress Notes (Signed)
NO SHOW

## 2016-05-10 ENCOUNTER — Encounter: Payer: Self-pay | Admitting: Family Medicine

## 2016-05-10 ENCOUNTER — Ambulatory Visit (INDEPENDENT_AMBULATORY_CARE_PROVIDER_SITE_OTHER): Payer: Managed Care, Other (non HMO) | Admitting: Family Medicine

## 2016-05-10 VITALS — BP 122/90 | HR 88 | Temp 98.0°F | Ht 70.0 in | Wt 220.0 lb

## 2016-05-10 DIAGNOSIS — E785 Hyperlipidemia, unspecified: Secondary | ICD-10-CM | POA: Diagnosis not present

## 2016-05-10 DIAGNOSIS — Z23 Encounter for immunization: Secondary | ICD-10-CM

## 2016-05-10 DIAGNOSIS — N529 Male erectile dysfunction, unspecified: Secondary | ICD-10-CM | POA: Diagnosis not present

## 2016-05-10 DIAGNOSIS — Z6831 Body mass index (BMI) 31.0-31.9, adult: Secondary | ICD-10-CM | POA: Diagnosis not present

## 2016-05-10 DIAGNOSIS — Z0000 Encounter for general adult medical examination without abnormal findings: Secondary | ICD-10-CM

## 2016-05-10 LAB — BASIC METABOLIC PANEL
BUN: 14 mg/dL (ref 6–23)
CALCIUM: 9.8 mg/dL (ref 8.4–10.5)
CHLORIDE: 100 meq/L (ref 96–112)
CO2: 26 meq/L (ref 19–32)
CREATININE: 0.92 mg/dL (ref 0.40–1.50)
GFR: 92.97 mL/min (ref >60.00)
GLUCOSE: 75 mg/dL (ref 70–99)
Potassium: 4.1 mEq/L (ref 3.5–5.1)
Sodium: 136 mEq/L (ref 135–145)

## 2016-05-10 LAB — HDL CHOLESTEROL: HDL: 39.6 mg/dL (ref >39.00)

## 2016-05-10 LAB — CHOLESTEROL, TOTAL
Cholesterol: 280 mg/dL — ABNORMAL HIGH (ref 0–200)
Cholesterol: 280 mg/dL — ABNORMAL HIGH (ref 0–200)

## 2016-05-10 LAB — HEMOGLOBIN A1C: Hgb A1c MFr Bld: 5.8 % (ref 4.6–6.5)

## 2016-05-10 MED ORDER — SILDENAFIL CITRATE 25 MG PO TABS: ORAL_TABLET | ORAL | 3 refills | Status: DC

## 2016-05-10 NOTE — Progress Notes (Signed)
Pre visit review using our clinic review tool, if applicable. No additional management support is needed unless otherwise documented below in the visit note. 

## 2016-05-10 NOTE — Patient Instructions (Addendum)
BEFORE YOU LEAVE: -flu shot -labs -follow up: 3-4 months  We have ordered labs or studies at this visit. It can take up to 1-2 weeks for results and processing. IF results require follow up or explanation, we will call you with instructions. Clinically stable results will be released to your [facility]. If you have not heard from us or cannot find your results in [facility] in 2 weeks please contact our office at [phone].  If you are not yet signed up for [facility], please consider signing up.  Vit D 3 [phone] IU daily   We recommend the following healthy lifestyle for LIFE: 1) Small portions.   Tip: eat off of a salad plate instead of a dinner plate.  Tip: It is ok to feel hungry after a meal - that likely means you ate an appropriate portion.  Tip: if you need more or a snack choose fruits, veggies and/or a handful of nuts or seeds.  2) Eat a healthy clean diet.  * Tip: Avoid (less then 1 serving per week): processed foods, sweets, sweetened drinks, white starches (rice, flour, bread, potatoes, pasta, etc), red meat, fast foods, butter  *Tip: CHOOSE instead   * 5-9 servings per day of fresh or frozen fruits and vegetables (but not corn, potatoes, bananas, canned or dried fruit)   *nuts and seeds, beans   *olives and olive oil   *small portions of lean meats such as fish and white chicken    *small portions of whole grains  3)Get at least 150 minutes of sweaty aerobic exercise per week.  4)Reduce stress - consider counseling, meditation and relaxation to balance other aspects of your life.

## 2016-05-10 NOTE — Progress Notes (Signed)
HPI:  Here for CPE:  -Concerns and/or follow up today:  Past medical history significant for hyperlipidemia, obesity. Reports was doing a good job with exercise and a healthy diet, but he stopped measures with the holidays. He plans to restart. He has a new complaint of erectile dysfunction. This is been on for about a year. He is able to obtain and maintain an erection sometimes, but more often than not he has difficulty. He would like to try medicines such as Viagra. He has not had any sexual stressors or more stress than usual. However he has quite stressed out by this issue.  -Diabetes and Dyslipidemia Screening: Due for labs  -Vaccines: UTD except for flu shot which she would like to get today  -sexual activity: yes, male partner, no new partners  -wants STI testing, Hep C screening (if born 17-1963):  He does want to go ahead and do testing for ST eyes, but has no concerns or symptoms-FH colon or prstate ca: see FH Last colon cancer screening: n/a Last prostate ca screening: n/a  -Alcohol, Tobacco, drug use: see social history  Review of Systems - no fevers, unintentional weight loss, vision loss, hearing loss, chest pain, sob, hemoptysis, melena, hematochezia, hematuria, genital discharge, changing or concerning skin lesions, bleeding, bruising, loc, thoughts of self harm or SI  Past Medical History:  Diagnosis Date  . Allergy   . Chicken pox   . Hyperlipidemia 02/21/2012  . Otitis media    followed by ENT     Past Surgical History:  Procedure Laterality Date  . EYE SURGERY    . NECK SURGERY  2007  . SPINE SURGERY    . TYMPANOSTOMY TUBE PLACEMENT    . VASECTOMY      Family History  Problem Relation Age of Onset  . Hypertension Paternal Grandmother   . Heart disease Paternal Grandmother   . Hypertension Paternal Grandfather   . Heart disease Paternal Grandfather   . Alcohol abuse      Grandparent  . Hyperlipidemia Father     Social History   Social  History  . Marital status: Divorced    Spouse name: N/A  . Number of children: N/A  . Years of education: N/A   Social History Main Topics  . Smoking status: Former Smoker    Types: E-cigarettes  . Smokeless tobacco: None  . Alcohol use 8.4 oz/week    14 Cans of beer per week     Comment: 3-4 per week  . Drug use: No  . Sexual activity: Not Asked   Other Topics Concern  . None   Social History Narrative  . None     Current Outpatient Prescriptions:  .  DiphenhydrAMINE HCl (BENADRYL ALLERGY PO), Take by mouth as needed., Disp: , Rfl:  .  fexofenadine (ALLEGRA) 180 MG tablet, Take 180 mg by mouth daily., Disp: , Rfl:  .  meloxicam (MOBIC) 15 MG tablet, Take 1 tablet (15 mg total) by mouth daily., Disp: 30 tablet, Rfl: 2 .  pravastatin (PRAVACHOL) 40 MG tablet, Take 1 tablet (40 mg total) by mouth daily., Disp: 90 tablet, Rfl: 1 .  Pseudoephedrine HCl (SUDAFED PO), Take by mouth., Disp: , Rfl:  .  sildenafil (VIAGRA) 25 MG tablet, 1-2 tablets as needed prior to sexual activity. Do not take more the one dose in 24 hours, Disp: 10 tablet, Rfl: 3  Current Facility-Administered Medications:  .  albuterol (PROVENTIL) (2.5 MG/3ML) 0.083% nebulizer solution 2.5 mg, 2.5 mg, Nebulization,  Once, [employer], PA-C  EXAM:  Vitals:   05/10/16 1318  BP: 122/90  Pulse: 88  Temp: 98 F (36.7 C)  TempSrc: Oral  Weight: 220 lb (99.8 kg)  Height: 5\' 10"  (1.778 m)    Estimated body mass index is 31.57 kg/m as calculated from the following:   Height as of this encounter: 5\' 10"  (1.778 m).   Weight as of this encounter: 220 lb (99.8 kg).  GENERAL: vitals reviewed and listed below, alert, oriented, appears well hydrated and in no acute distress  HEENT: head atraumatic, PERRLA, normal appearance of eyes, ears, nose and mouth. Tube R TM ear falling out, moist mucus membranes.  NECK: supple, no masses or lymphadenopathy  LUNGS: clear to auscultation bilaterally, no rales, rhonchi or  wheeze  CV: HRRR, no peripheral edema or cyanosis, normal pedal pulses  ABDOMEN: bowel sounds normal, soft, non tender to palpation, no masses, no rebound or guarding  GU: delinced  RECTAL: refused  SKIN: no rash or abnormal lesions, he declined full skin exam  MS: normal gait, moves all extremities normally  NEURO: normal gait, speech and thought processing grossly intact, muscle tone grossly intact throughout  PSYCH: normal affect, pleasant and cooperative  ASSESSMENT AND PLAN:  Discussed the following assessment and plan:  Encounter for preventive health examination - Plan: Hemoglobin A1c, Basic metabolic panel, HIV antibody (with reflex), RPR, GC/chlamydia probe amp, urine, GC/Chlamydia Probe Amp -Discussed and advised all [place] preventive services health task force level A and B recommendations for age, sex and risks. -Advised at least 150 minutes of exercise per week and a healthy diet with avoidance of (less then 1 serving per week) processed foods, white starches, red meat, fast foods and sweets and consisting of: * 5-9 servings of fresh fruits and vegetables (not corn or potatoes) *nuts and seeds, beans *olives and olive oil *lean meats such as fish and white chicken  *whole grains -labs, studies and vaccines per orders this encounter  Hyperlipidemia, unspecified hyperlipidemia type - Plan: HDL cholesterol, Cholesterol, total -lifestyle recs, check labs, not taking statin consistently  BMI 31.0-31.9,adult -lifestyle recs  Erectile dysfunction, unspecified erectile dysfunction type -we discussed possible serious and likely etiologies, workup and treatment, treatment risks and return precautions -after this discussion, Myka opted for trial viagra (discussed risks at length, he prefers to try generic), consider cbt, healthy lifestle -follow up advised 3 mnths -of course, we advised Nashawn  to return or notify a doctor immediately if symptoms worsen or persist or new  concerns arise.  Encounter for immunization - Plan: Flu Vaccine QUAD 36+ mos IM  Patient advised to return to clinic immediately if symptoms worsen or persist or new concerns.  Patient Instructions  BEFORE YOU LEAVE: -flu shot -labs -follow up: 3-4 months  We have ordered labs or studies at this visit. It can take up to 1-2 weeks for results and processing. IF results require follow up or explanation, we will call you with instructions. Clinically stable results will be released to your [facility]. If you have not heard from [place] or cannot find your results in [facility] in 2 weeks please contact our office at [phone].  If you are not yet signed up for [facility], please consider signing up.  Vit D 3 [phone] IU daily   We recommend the following healthy lifestyle for LIFE: 1) Small portions.   Tip: eat off of a salad plate instead of a dinner plate.  Tip: It is ok to feel hungry after  a meal - that likely means you ate an appropriate portion.  Tip: if you need more or a snack choose fruits, veggies and/or a handful of nuts or seeds.  2) Eat a healthy clean diet.  * Tip: Avoid (less then 1 serving per week): processed foods, sweets, sweetened drinks, white starches (rice, flour, bread, potatoes, pasta, etc), red meat, fast foods, butter  *Tip: CHOOSE instead   * 5-9 servings per day of fresh or frozen fruits and vegetables (but not corn, potatoes, bananas, canned or dried fruit)   *nuts and seeds, beans   *olives and olive oil   *small portions of lean meats such as fish and white chicken    *small portions of whole grains  3)Get at least 150 minutes of sweaty aerobic exercise per week.  4)Reduce stress - consider counseling, meditation and relaxation to balance other aspects of your life.          No Follow-up on file.   Kriste BasqueKIM, HANNAH R., DO

## 2016-05-11 LAB — RPR

## 2016-05-11 LAB — HIV ANTIBODY (ROUTINE TESTING W REFLEX): HIV 1&2 Ab, 4th Generation: NONREACTIVE

## 2016-05-12 LAB — GC/CHLAMYDIA PROBE AMP
CT Probe RNA: NOT DETECTED
GC PROBE AMP APTIMA: NOT DETECTED

## 2016-06-11 ENCOUNTER — Other Ambulatory Visit: Payer: Self-pay | Admitting: Family Medicine

## 2016-08-05 NOTE — Progress Notes (Signed)
HPI:  Follow up HLD, Obesity,Hyperglycemia, ED. Was not taking a statin on a regular basis prior to his last labs. Now taking pravastatin daily. Reports doing well except for seasonal allergies. Takes daily benadryl and sudafed for this - took this morning. Sometimes uses zyrtec. Did not like INS. No fevers, malaise, SOB. Exercise 0-2 times per week. Feels diet has improved a little. More veggies, less red meet. No CP, SOB, DOE. Denies. Due for recheck lipids.  ROS: See pertinent positives and negatives per HPI.  Past Medical History:  Diagnosis Date  . Allergy   . Chicken pox   . Hyperlipidemia 02/21/2012  . 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 Otitis media    followed by ENT   . Trigger finger, acquired 07/01/2015   Involves 5th MCP on RT with nodule noted on [place]  Thumb may rarely trigger     Past Surgical History:  Procedure Laterality Date  . EYE SURGERY    . NECK SURGERY  2007  . SPINE SURGERY    . TYMPANOSTOMY TUBE PLACEMENT    . VASECTOMY      Family History  Problem Relation Age of Onset  . Hypertension Paternal Grandmother   . Heart disease Paternal Grandmother   . Hypertension Paternal Grandfather   . Heart disease Paternal Grandfather   . Alcohol abuse      Grandparent  . Hyperlipidemia Father     Social History   Social History  . Marital status: Divorced    Spouse name: N/A  . Number of children: N/A  . Years of education: N/A   Social History Main Topics  . Smoking status: Former Smoker    Types: E-cigarettes  . Smokeless tobacco: Never Used  . Alcohol use 8.4 oz/week    14 Cans of beer per week     Comment: 3-4 per week  . Drug use: No  . Sexual activity: Not Asked   Other Topics Concern  . None   Social History Narrative  . None     Current Outpatient Prescriptions:  .  DiphenhydrAMINE HCl (BENADRYL ALLERGY PO), Take by mouth as needed., Disp: , Rfl:  .  fexofenadine (ALLEGRA) 180 MG  tablet, Take 180 mg by mouth daily., Disp: , Rfl:  .  pravastatin (PRAVACHOL) 40 MG tablet, TAKE 1 TABLET(40 MG) BY MOUTH DAILY, Disp: 90 tablet, Rfl: 1 .  Pseudoephedrine HCl (SUDAFED PO), Take by mouth., Disp: , Rfl:  .  sildenafil (VIAGRA) 25 MG tablet, 1-2 tablets as needed prior to sexual activity. Do not take more the one dose in 24 hours, Disp: 10 tablet, Rfl: 3  Current Facility-Administered Medications:  .  albuterol (PROVENTIL) (2.5 MG/3ML) 0.083% nebulizer solution 2.5 mg, 2.5 mg, Nebulization, Once, [employer], PA-C  EXAM:  Vitals:   08/08/16 0845  BP: 120/90  Pulse: 79  Temp: 98.2 F (36.8 C)    Body mass index is 32.83 kg/m.  GENERAL: vitals reviewed and listed above, alert, oriented, appears well hydrated and in no acute distress  HEENT: atraumatic, conjunttiva clear, no obvious abnormalities on inspection of external nose and ears  NECK: no obvious masses on inspection  LUNGS: clear to auscultation bilaterally, no wheezes, rales or rhonchi, good air movement  CV: HRRR, no peripheral edema  MS: moves all extremities without noticeable abnormality  PSYCH: pleasant and cooperative, no obvious depression or anxiety  ASSESSMENT AND PLAN:  Discussed the following assessment  and plan:  Hyperlipidemia, unspecified hyperlipidemia type - Plan: Lipid panel  BMI 32.0-32.9,adult  Elevated blood pressure reading  Chronic seasonal allergic rhinitis, unspecified trigger  Hyperglycemia  -discussed options for allergies - advised zyrtec or claritin -elevated BP - may be from OTC med - a little better on recheck --> recheck in 1 month -lifestyle recs -lipid recheck today -Patient advised to return or notify a doctor immediately if symptoms worsen or persist or new concerns arise.  Patient Instructions  BEFORE YOU LEAVE: -follow up: 1 month for hypertension -lab  We recommend the following healthy lifestyle for LIFE: 1) Small portions.   Tip: eat off of  a salad plate instead of a dinner plate.  Tip: It is ok to feel hungry after a meal of proper portion size  Tip: if you need more or a snack choose fruits, veggies and/or a handful of nuts or seeds.  2) Eat a healthy clean diet.  * Tip: Avoid (less then 1 serving per week): processed foods, sweets, sweetened drinks, white starches (rice, flour, bread, potatoes, pasta, etc), red meat, fast foods, butter  *Tip: CHOOSE instead   * 5-9 servings per day of fresh or frozen fruits and vegetables (but not corn, potatoes, bananas, canned or dried fruit)   *nuts and seeds, beans   *olives and olive oil   *small portions of lean meats such as fish and white chicken    *small portions of whole grains  3)Get at least 150 minutes of sweaty aerobic exercise per week.  4)Reduce stress - consider counseling, meditation and relaxation to balance other aspects of your life.  We have ordered labs or studies at this visit. It can take up to 1-2 weeks for results and processing. IF results require follow up or explanation, we will call you with instructions. Clinically stable results will be released to your [facility]. If you have not heard from [place] or cannot find your results in [facility] in 2 weeks please contact our office at [phone].  If you are not yet signed up for [facility], please consider signing up.   WE NOW OFFER   [facility] FAST TRACK!!!  SAME DAY Appointments for ACUTE CARE  Such as: Sprains, Injuries, cuts, abrasions, rashes, muscle pain, joint pain, back pain Colds, flu, sore throats, headache, allergies, cough, fever  Ear pain, sinus and eye infections Abdominal pain, nausea, vomiting, diarrhea, upset stomach Animal/insect bites  3 Easy Ways to Schedule: Walk-In Scheduling Call in scheduling Mychart Sign-up: [email or web address]           Kriste Basque R., DO

## 2016-08-08 ENCOUNTER — Ambulatory Visit (INDEPENDENT_AMBULATORY_CARE_PROVIDER_SITE_OTHER): Payer: Managed Care, Other (non HMO) | Admitting: Family Medicine

## 2016-08-08 ENCOUNTER — Encounter: Payer: Self-pay | Admitting: Family Medicine

## 2016-08-08 VITALS — BP 120/90 | HR 79 | Temp 98.2°F | Ht 70.0 in | Wt 228.8 lb

## 2016-08-08 DIAGNOSIS — R739 Hyperglycemia, unspecified: Secondary | ICD-10-CM | POA: Diagnosis not present

## 2016-08-08 DIAGNOSIS — J302 Other seasonal allergic rhinitis: Secondary | ICD-10-CM

## 2016-08-08 DIAGNOSIS — E785 Hyperlipidemia, unspecified: Secondary | ICD-10-CM

## 2016-08-08 DIAGNOSIS — R030 Elevated blood-pressure reading, without diagnosis of hypertension: Secondary | ICD-10-CM

## 2016-08-08 DIAGNOSIS — Z6832 Body mass index (BMI) 32.0-32.9, adult: Secondary | ICD-10-CM

## 2016-08-08 LAB — LIPID PANEL
CHOL/HDL RATIO: 6
Cholesterol: 223 mg/dL — ABNORMAL HIGH (ref 0–200)
HDL: 39.5 mg/dL (ref >39.00)
NONHDL: 183.32
TRIGLYCERIDES: 231 mg/dL — AB (ref 0.0–149.0)
VLDL: 46.2 mg/dL — ABNORMAL HIGH (ref 0.0–40.0)

## 2016-08-08 LAB — LDL CHOLESTEROL, DIRECT: LDL DIRECT: 139 mg/dL

## 2016-08-08 NOTE — Patient Instructions (Signed)
BEFORE YOU LEAVE: -follow up: 1 month for hypertension -lab  We recommend the following healthy lifestyle for LIFE: 1) Small portions.   Tip: eat off of a salad plate instead of a dinner plate.  Tip: It is ok to feel hungry after a meal of proper portion size  Tip: if you need more or a snack choose fruits, veggies and/or a handful of nuts or seeds.  2) Eat a healthy clean diet.  * Tip: Avoid (less then 1 serving per week): processed foods, sweets, sweetened drinks, white starches (rice, flour, bread, potatoes, pasta, etc), red meat, fast foods, butter  *Tip: CHOOSE instead   * 5-9 servings per day of fresh or frozen fruits and vegetables (but not corn, potatoes, bananas, canned or dried fruit)   *nuts and seeds, beans   *olives and olive oil   *small portions of lean meats such as fish and white chicken    *small portions of whole grains  3)Get at least 150 minutes of sweaty aerobic exercise per week.  4)Reduce stress - consider counseling, meditation and relaxation to balance other aspects of your life.  We have ordered labs or studies at this visit. It can take up to 1-2 weeks for results and processing. IF results require follow up or explanation, we will call you with instructions. Clinically stable results will be released to your [facility]. If you have not heard from [place] or cannot find your results in [facility] in 2 weeks please contact our office at [phone].  If you are not yet signed up for [facility], please consider signing up.   WE NOW OFFER   [facility] FAST TRACK!!!  SAME DAY Appointments for ACUTE CARE  Such as: Sprains, Injuries, cuts, abrasions, rashes, muscle pain, joint pain, back pain Colds, flu, sore throats, headache, allergies, cough, fever  Ear pain, sinus and eye infections Abdominal pain, nausea, vomiting, diarrhea, upset stomach Animal/insect bites  3 Easy Ways to Schedule: Walk-In Scheduling Call in scheduling Mychart Sign-up:  [email or web address]

## 2016-08-08 NOTE — Progress Notes (Signed)
Pre visit review using our clinic review tool, if applicable. No additional management support is needed unless otherwise documented below in the visit note. 

## 2016-09-07 NOTE — Progress Notes (Deleted)
HPI:   Timothy Jennings is a pleasant 51 yo with a PMH hyperlipidemia, hyperglycemia here for follow up after and elevated BP reading at his last visit. He thought it was due to an OTC med. ROS: See pertinent positives and negatives per HPI.  Past Medical History:  Diagnosis Date  . Allergy   . Chicken pox   . Hyperlipidemia 02/21/2012  . Osteoarthritis of right thumb 07/01/2015   Noted with spurring and subluxation on US   . Otitis media    followed by ENT   . Trigger finger, acquired 07/01/2015   Involves 5th MCP on RT with nodule noted on US  Thumb may rarely trigger     Past Surgical History:  Procedure Laterality Date  . EYE SURGERY    . NECK SURGERY  2007  . SPINE SURGERY    . TYMPANOSTOMY TUBE PLACEMENT    . VASECTOMY      Family History  Problem Relation Age of Onset  . Hypertension Paternal Grandmother   . Heart disease Paternal Grandmother   . Hypertension Paternal Grandfather   . Heart disease Paternal Grandfather   . Alcohol abuse      Grandparent  . Hyperlipidemia Father     Social History   Social History  . Marital status: Divorced    Spouse name: N/A  . Number of children: N/A  . Years of education: N/A   Social History Main Topics  . Smoking status: Former Smoker    Types: E-cigarettes  . Smokeless tobacco: Never Used  . Alcohol use 8.4 oz/week    14 Cans of beer per week     Comment: 3-4 per week  . Drug use: No  . Sexual activity: Not on file   Other Topics Concern  . Not on file   Social History Narrative  . No narrative on file     Current Outpatient Prescriptions:  .  DiphenhydrAMINE HCl (BENADRYL ALLERGY PO), Take by mouth as needed., Disp: , Rfl:  .  fexofenadine (ALLEGRA) 180 MG tablet, Take 180 mg by mouth daily., Disp: , Rfl:  .  pravastatin (PRAVACHOL) 40 MG tablet, TAKE 1 TABLET(40 MG) BY MOUTH DAILY, Disp: 90 tablet, Rfl: 1 .  Pseudoephedrine HCl (SUDAFED PO), Take by mouth., Disp: , Rfl:  .  sildenafil (VIAGRA) 25 MG  tablet, 1-2 tablets as needed prior to sexual activity. Do not take more the one dose in 24 hours, Disp: 10 tablet, Rfl: 3  Current Facility-Administered Medications:  .  albuterol (PROVENTIL) (2.5 MG/3ML) 0.083% nebulizer solution 2.5 mg, 2.5 mg, Nebulization, Once, Marte, Heather M, PA-C  EXAM:  There were no vitals filed for this visit.  There is no height or weight on file to calculate BMI.  GENERAL: vitals reviewed and listed above, alert, oriented, appears well hydrated and in no acute distress  HEENT: atraumatic, conjunttiva clear, no obvious abnormalities on inspection of external nose and ears  NECK: no obvious masses on inspection  LUNGS: clear to auscultation bilaterally, no wheezes, rales or rhonchi, good air movement  CV: HRRR, no peripheral edema  MS: moves all extremities without noticeable abnormality  PSYCH: pleasant and cooperative, no obvious depression or anxiety  ASSESSMENT AND PLAN:  Discussed the following assessment and plan:  No diagnosis found.  -Patient advised to return or notify a doctor immediately if symptoms worsen or persist or new concerns arise.  There are no Patient Instructions on file for this visit.  Kriste BasqueKIM, Brayah Urquilla R., DO

## 2016-09-08 ENCOUNTER — Ambulatory Visit: Payer: Managed Care, Other (non HMO) | Admitting: Family Medicine

## 2016-12-17 ENCOUNTER — Other Ambulatory Visit: Payer: Self-pay | Admitting: Family Medicine

## 2017-01-19 ENCOUNTER — Encounter: Payer: Self-pay | Admitting: Family Medicine

## 2017-01-29 ENCOUNTER — Other Ambulatory Visit: Payer: Self-pay | Admitting: Family Medicine

## 2017-03-04 ENCOUNTER — Other Ambulatory Visit: Payer: Self-pay | Admitting: Family Medicine

## 2017-05-07 ENCOUNTER — Other Ambulatory Visit: Payer: Self-pay | Admitting: Family Medicine

## 2017-05-11 ENCOUNTER — Encounter: Payer: Self-pay | Admitting: Family Medicine

## 2017-05-24 NOTE — Progress Notes (Signed)
HPI:  Here for CPE:  -Concerns and/or follow up today: none PMH hyperlipidemia and ED. Due for labs and flu shot - colon ca screening next month.  He wants to do colonoscopy as his mother had colon cancer.  She just had resection of her bowel. Feels like his allergies are worse.  He takes Claritin and Benadryl daily.  His worst symptom is nasal stuffiness.  He also gets an itchy rash on his neck at times from his allergies.  Reports he saw an allergist in the past and he is allergic to everything except his dogs.  Told him at the time he was not a good candidate for allergy shots though.  Denies wheezing, shortness of breath, sinus pain or fevers. Refill on his medication for erectile dysfunction. -Diet: Not is not great, but he wishes to improve -Exercise: Just started exercising -Diabetes and Dyslipidemia Screening: Not fasting today, he wants to schedule fasting labs -Hx of HTN: no -Vaccines: UTD -sexual activity: yes, male partner, no new partners -wants STI testing, Hep C screening (if born 51-1963): He wants to check HIV and RPR -FH colon or prstate ca: see FH Last colon cancer screening: due this year when turns 52.  Last prostate ca screening:n/a -Alcohol, Tobacco, drug use: see social history  Review of Systems - no fevers, unintentional weight loss, vision loss, hearing loss, chest pain, sob, hemoptysis, melena, hematochezia, hematuria, genital discharge, changing or concerning skin lesions, bleeding, bruising, loc, thoughts of self harm or SI  Past Medical History:  Diagnosis Date  . Allergy   . Chicken pox   . Hyperlipidemia 02/21/2012  . 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 Otitis media    followed by ENT   . Trigger finger, acquired 07/01/2015   Involves 5th MCP on RT with nodule noted on [place]  Thumb may rarely trigger     Past Surgical History:  Procedure Laterality Date  . EYE SURGERY    . NECK SURGERY  2007    . SPINE SURGERY    . TYMPANOSTOMY TUBE PLACEMENT    . VASECTOMY      Family History  Problem Relation Age of Onset  . Hypertension Paternal Grandmother   . Heart disease Paternal Grandmother   . Hypertension Paternal Grandfather   . Heart disease Paternal Grandfather   . Alcohol abuse Unknown        Grandparent  . Hyperlipidemia Father     Social History   Socioeconomic History  . Marital status: Divorced    Spouse name: None  . Number of children: None  . Years of education: None  . Highest education level: None  Social Needs  . Financial resource strain: None  . Food insecurity - worry: None  . Food insecurity - inability: None  . Transportation needs - medical: None  . Transportation needs - non-medical: None  Occupational History  . None  Tobacco Use  . Smoking status: Former Smoker    Types: E-cigarettes  . Smokeless tobacco: Never Used  Substance and Sexual Activity  . Alcohol use: Yes    Alcohol/week: 8.4 oz    Types: 14 Cans of beer per week    Comment: 3-4 per week  . Drug use: No  . Sexual activity: None  Other Topics Concern  . None  Social History Narrative  . None     Current Outpatient Medications:  .  DiphenhydrAMINE HCl (BENADRYL ALLERGY PO), Take  by mouth as needed., Disp: , Rfl:  .  loratadine (CLARITIN) 10 MG tablet, Take 10 mg by mouth daily., Disp: , Rfl:  .  pravastatin (PRAVACHOL) 40 MG tablet, TAKE 1 TABLET(40 MG) BY MOUTH DAILY, Disp: 90 tablet, Rfl: 0 .  Pseudoephedrine HCl (SUDAFED PO), Take by mouth., Disp: , Rfl:  .  sildenafil (VIAGRA) 25 MG tablet, TAKE 1 TO 2 TABLETS BY MOUTH AS NEEDED FOR SEXUAL ACTIVITY. PATIENT NEEDS TO BE SEEN, Disp: 10 tablet, Rfl: 3  Current Facility-Administered Medications:  .  albuterol (PROVENTIL) (2.5 MG/3ML) 0.083% nebulizer solution 2.5 mg, 2.5 mg, Nebulization, Once, Marte, Heather M, PA-C  EXAM:  Vitals:   05/25/17 1505  BP: 110/80  Pulse: 81  Temp: 98.1 F (36.7 C)  TempSrc: Oral   Weight: 238 lb (108 kg)  Height: 5' 10.5" (1.791 m)    Estimated body mass index is 33.67 kg/m as calculated from the following:   Height as of this encounter: 5' 10.5" (1.791 m).   Weight as of this encounter: 238 lb (108 kg).  GENERAL: vitals reviewed and listed below, alert, oriented, appears well hydrated and in no acute distress  HEENT: head atraumatic, PERRLA, normal appearance of eyes, ears, nose and mouth. moist mucus membranes.normal appearance of ear canals and TMs, clear nasal congestion, mild post oropharyngeal erythema with PND, no tonsillar edema or exudate, no sinus TTP  NECK: supple, no masses or lymphadenopathy  LUNGS: clear to auscultation bilaterally, no rales, rhonchi or wheeze  CV: HRRR, no peripheral edema or cyanosis, normal pedal pulses  ABDOMEN: bowel sounds normal, soft, non tender to palpation, no masses, no rebound or guarding  GU: declined  SKIN: no rash or abnormal lesions -dry skin on exposed portions, he declined a full skin check  MS: normal gait, moves all extremities normally  NEURO: normal gait, speech and thought processing grossly intact, muscle tone grossly intact throughout  PSYCH: normal affect, pleasant and cooperative  ASSESSMENT AND PLAN:  Discussed the following assessment and plan:  PREVENTIVE EXAM: -Discussed and advised all [place] preventive services health task force level A and B recommendations for age, sex and risks. -Advised at least 150 minutes of exercise per week and a healthy diet with avoidance of (less then 1 serving per week) processed foods, white starches, red meat, fast foods and sweets and consisting of: * 5-9 servings of fresh fruits and vegetables (not corn or potatoes) *nuts and seeds, beans *olives and olive oil *lean meats such as fish and white chicken  *whole grains -labs, studies and vaccines per orders this encounter  2. Screening for depression -Negative  3. Allergic rhinitis, unspecified  seasonality, unspecified trigger -Try switching to Zyrtec and Nasonex -Advised stopping dual antihistamine for now as he reports feels dry at night -Consider adding Singulair or reevaluation with allergist  4. Hyperglycemia -Lifestyle recommendations - Hemoglobin A1c  5. Hyperlipidemia, unspecified hyperlipidemia type -With lifestyle recommendations - Lipid panel  6. Routine screening for STI (sexually transmitted infection) - HIV antibody - RPR  7. Colon cancer screening 8. FH: colon cancer -Sent referral for colonoscopy  - turning 52 next month  9. Need for immunization against influenza - Flu Vaccine QUAD 6+ mos PF IM (Fluarix Quad PF)  He wants to return for fasting labs. Patient Instructions  BEFORE YOU LEAVE: -flu shot -follow up: 4-6 months  Try stopping current allergy medications and try zyrtec at night and nasonex instead.  -We placed a referral for you as discussed for colon  cancer screening. It usually takes about 1-2 weeks to process and schedule this referral. If you have not heard from [place] regarding this appointment in 2 weeks please contact our office.  We have ordered labs or studies at this visit. It can take up to 1-2 weeks for results and processing. IF results require follow up or explanation, we will call you with instructions. Clinically stable results will be released to your [facility]. If you have not heard from [place] or cannot find your results in [facility] in 2 weeks please contact our office at [phone].  If you are not yet signed up for [facility], please consider signing up.   Preventive Care 40-64 Years, Male Preventive care refers to lifestyle choices and visits with your health care provider that can promote health and wellness. What does preventive care include?  A yearly physical exam. This is also called an annual well check.  Dental exams once or twice a year.  Routine eye exams. Ask your health care provider how often you should have your  eyes checked.  Personal lifestyle choices, including: ? Daily care of your teeth and gums. ? Regular physical activity. ? Eating a healthy diet. ? Avoiding tobacco and drug use. ? Limiting alcohol use. ? Practicing safe sex. ? Taking low-dose aspirin every day starting at age 73. What happens during an annual well check? The services and screenings done by your health care provider during your annual well check will depend on your age, overall health, lifestyle risk factors, and family history of disease. Counseling Your health care provider may ask you questions about your:  Alcohol use.  Tobacco use.  Drug use.  Emotional well-being.  Home and relationship well-being.  Sexual activity.  Eating habits.  Work and work Statistician.  Screening You may have the following tests or measurements:  Height, weight, and BMI.  Blood pressure.  Lipid and cholesterol levels. These may be checked every 5 years, or more frequently if you are over 24 years old.  Skin check.  Lung cancer screening. You may have this screening every year starting at age 90 if you have a 30-pack-year history of smoking and currently smoke or have quit within the past 15 years.  Fecal occult blood test (FOBT) of the stool. You may have this test every year starting at age 56.  Flexible sigmoidoscopy or colonoscopy. You may have a sigmoidoscopy every 5 years or a colonoscopy every 10 years starting at age 40.  Prostate cancer screening. Recommendations will vary depending on your family history and other risks.  Hepatitis C blood test.  Hepatitis B blood test.  Sexually transmitted disease (STD) testing.  Diabetes screening. This is done by checking your blood sugar (glucose) after you have not eaten for a while (fasting). You may have this done every 1-3 years.  Discuss your test results, treatment options, and if necessary, the need for more tests with your health care  provider. Vaccines Your health care provider may recommend certain vaccines, such as:  Influenza vaccine. This is recommended every year.  Tetanus, diphtheria, and acellular pertussis (Tdap, Td) vaccine. You may need a Td booster every 10 years.  Varicella vaccine. You may need this if you have not been vaccinated.  Zoster vaccine. You may need this after age 65.  Measles, mumps, and rubella (MMR) vaccine. You may need at least one dose of MMR if you were born in 1957 or later. You may also need a second dose.  Pneumococcal 13-valent conjugate (PCV13)  vaccine. You may need this if you have certain conditions and have not been vaccinated.  Pneumococcal polysaccharide (PPSV23) vaccine. You may need one or two doses if you smoke cigarettes or if you have certain conditions.  Meningococcal vaccine. You may need this if you have certain conditions.  Hepatitis A vaccine. You may need this if you have certain conditions or if you travel or work in places where you may be exposed to hepatitis A.  Hepatitis B vaccine. You may need this if you have certain conditions or if you travel or work in places where you may be exposed to hepatitis B.  Haemophilus influenzae type b (Hib) vaccine. You may need this if you have certain risk factors.  Talk to your health care provider about which screenings and vaccines you need and how often you need them. This information is not intended to replace advice given to you by your health care provider. Make sure you discuss any questions you have with your health care provider. Document Released: 05/15/2015 Document Revised: 01/06/2016 Document Reviewed: 02/17/2015 Elsevier Interactive Patient Education  2018 [employer].          No Follow-up on file.   Lucretia Kern, DO

## 2017-05-25 ENCOUNTER — Encounter: Payer: Self-pay | Admitting: Family Medicine

## 2017-05-25 ENCOUNTER — Telehealth: Payer: Self-pay | Admitting: *Deleted

## 2017-05-25 ENCOUNTER — Ambulatory Visit (INDEPENDENT_AMBULATORY_CARE_PROVIDER_SITE_OTHER): Payer: Managed Care, Other (non HMO) | Admitting: Family Medicine

## 2017-05-25 VITALS — BP 110/80 | HR 81 | Temp 98.1°F | Ht 70.5 in | Wt 238.0 lb

## 2017-05-25 DIAGNOSIS — Z1211 Encounter for screening for malignant neoplasm of colon: Secondary | ICD-10-CM

## 2017-05-25 DIAGNOSIS — Z1331 Encounter for screening for depression: Secondary | ICD-10-CM

## 2017-05-25 DIAGNOSIS — Z23 Encounter for immunization: Secondary | ICD-10-CM | POA: Diagnosis not present

## 2017-05-25 DIAGNOSIS — R739 Hyperglycemia, unspecified: Secondary | ICD-10-CM

## 2017-05-25 DIAGNOSIS — Z800 Family history of malignant neoplasm of digestive organs: Secondary | ICD-10-CM | POA: Diagnosis not present

## 2017-05-25 DIAGNOSIS — Z113 Encounter for screening for infections with a predominantly sexual mode of transmission: Secondary | ICD-10-CM | POA: Diagnosis not present

## 2017-05-25 DIAGNOSIS — E785 Hyperlipidemia, unspecified: Secondary | ICD-10-CM | POA: Diagnosis not present

## 2017-05-25 DIAGNOSIS — Z0001 Encounter for general adult medical examination with abnormal findings: Secondary | ICD-10-CM | POA: Diagnosis not present

## 2017-05-25 DIAGNOSIS — J309 Allergic rhinitis, unspecified: Secondary | ICD-10-CM

## 2017-05-25 DIAGNOSIS — Z0000 Encounter for general adult medical examination without abnormal findings: Secondary | ICD-10-CM

## 2017-05-25 MED ORDER — SILDENAFIL CITRATE 25 MG PO TABS: ORAL_TABLET | ORAL | 3 refills | Status: DC

## 2017-05-25 NOTE — Patient Instructions (Signed)
BEFORE YOU LEAVE: -flu shot -follow up: 4-6 months  Try stopping current allergy medications and try zyrtec at night and nasonex instead.  -We placed a referral for you as discussed for colon cancer screening. It usually takes about 1-2 weeks to process and schedule this referral. If you have not heard from [place] regarding this appointment in 2 weeks please contact our office.  We have ordered labs or studies at this visit. It can take up to 1-2 weeks for results and processing. IF results require follow up or explanation, we will call you with instructions. Clinically stable results will be released to your [facility]. If you have not heard from [place] or cannot find your results in [facility] in 2 weeks please contact our office at [phone].  If you are not yet signed up for [facility], please consider signing up.   Preventive Care 40-64 Years, Male Preventive care refers to lifestyle choices and visits with your health care provider that can promote health and wellness. What does preventive care include?  A yearly physical exam. This is also called an annual well check.  Dental exams once or twice a year.  Routine eye exams. Ask your health care provider how often you should have your eyes checked.  Personal lifestyle choices, including: ? Daily care of your teeth and gums. ? Regular physical activity. ? Eating a healthy diet. ? Avoiding tobacco and drug use. ? Limiting alcohol use. ? Practicing safe sex. ? Taking low-dose aspirin every day starting at age 38. What happens during an annual well check? The services and screenings done by your health care provider during your annual well check will depend on your age, overall health, lifestyle risk factors, and family history of disease. Counseling Your health care provider may ask you questions about your:  Alcohol use.  Tobacco use.  Drug use.  Emotional well-being.  Home and relationship well-being.  Sexual activity.  Eating  habits.  Work and work Statistician.  Screening You may have the following tests or measurements:  Height, weight, and BMI.  Blood pressure.  Lipid and cholesterol levels. These may be checked every 5 years, or more frequently if you are over 50 years old.  Skin check.  Lung cancer screening. You may have this screening every year starting at age 28 if you have a 30-pack-year history of smoking and currently smoke or have quit within the past 15 years.  Fecal occult blood test (FOBT) of the stool. You may have this test every year starting at age 7.  Flexible sigmoidoscopy or colonoscopy. You may have a sigmoidoscopy every 5 years or a colonoscopy every 10 years starting at age 93.  Prostate cancer screening. Recommendations will vary depending on your family history and other risks.  Hepatitis C blood test.  Hepatitis B blood test.  Sexually transmitted disease (STD) testing.  Diabetes screening. This is done by checking your blood sugar (glucose) after you have not eaten for a while (fasting). You may have this done every 1-3 years.  Discuss your test results, treatment options, and if necessary, the need for more tests with your health care provider. Vaccines Your health care provider may recommend certain vaccines, such as:  Influenza vaccine. This is recommended every year.  Tetanus, diphtheria, and acellular pertussis (Tdap, Td) vaccine. You may need a Td booster every 10 years.  Varicella vaccine. You may need this if you have not been vaccinated.  Zoster vaccine. You may need this after age 13.  Measles, mumps,  and rubella (MMR) vaccine. You may need at least one dose of MMR if you were born in 1957 or later. You may also need a second dose.  Pneumococcal 13-valent conjugate (PCV13) vaccine. You may need this if you have certain conditions and have not been vaccinated.  Pneumococcal polysaccharide (PPSV23) vaccine. You may need one or two doses if you smoke  cigarettes or if you have certain conditions.  Meningococcal vaccine. You may need this if you have certain conditions.  Hepatitis A vaccine. You may need this if you have certain conditions or if you travel or work in places where you may be exposed to hepatitis A.  Hepatitis B vaccine. You may need this if you have certain conditions or if you travel or work in places where you may be exposed to hepatitis B.  Haemophilus influenzae type b (Hib) vaccine. You may need this if you have certain risk factors.  Talk to your health care provider about which screenings and vaccines you need and how often you need them. This information is not intended to replace advice given to you by your health care provider. Make sure you discuss any questions you have with your health care provider. Document Released: 05/15/2015 Document Revised: 01/06/2016 Document Reviewed: 02/17/2015 Elsevier Interactive Patient Education  [employer].

## 2017-05-25 NOTE — Telephone Encounter (Signed)
-----   Message from Terressa KoyanagiHannah R Kim, DO sent at 05/25/2017  4:54 PM EST ----- Timothy CollumJo anne, this patient wanted to set up a fasting lab visit, I believe tomorrow.  Continue please call him to arrange his fasting lab visit.  Also, I had already ordered his labs today.  Please order them as future.  Thank you.

## 2017-05-25 NOTE — Telephone Encounter (Signed)
I called the pt and he stated he spoke with someone up front that told him he does not need an appt and can just walk in at 8am for the lab tests.  Orders re-entered as future.

## 2017-05-26 ENCOUNTER — Encounter: Payer: Self-pay | Admitting: Internal Medicine

## 2017-05-26 LAB — HEMOGLOBIN A1C: Hgb A1c MFr Bld: 6 % (ref 4.6–6.5)

## 2017-05-26 LAB — LIPID PANEL
CHOLESTEROL: 199 mg/dL (ref 0–200)
HDL: 34.5 mg/dL — AB (ref >39.00)
NonHDL: 164.37
TRIGLYCERIDES: 249 mg/dL — AB (ref 0.0–149.0)
Total CHOL/HDL Ratio: 6
VLDL: 49.8 mg/dL — AB (ref 0.0–40.0)

## 2017-05-26 LAB — LDL CHOLESTEROL, DIRECT: LDL DIRECT: 119 mg/dL

## 2017-05-29 LAB — HIV ANTIBODY (ROUTINE TESTING W REFLEX): HIV 1&2 Ab, 4th Generation: NONREACTIVE

## 2017-05-29 LAB — RPR: RPR Ser Ql: NONREACTIVE

## 2017-07-02 ENCOUNTER — Other Ambulatory Visit: Payer: Self-pay | Admitting: Family Medicine

## 2017-07-05 ENCOUNTER — Encounter: Payer: Self-pay | Admitting: *Deleted

## 2017-07-21 ENCOUNTER — Ambulatory Visit (AMBULATORY_SURGERY_CENTER): Payer: Self-pay | Admitting: *Deleted

## 2017-07-21 ENCOUNTER — Other Ambulatory Visit: Payer: Self-pay

## 2017-07-21 ENCOUNTER — Encounter: Payer: Self-pay | Admitting: Internal Medicine

## 2017-07-21 VITALS — Ht 70.0 in | Wt 235.0 lb

## 2017-07-21 DIAGNOSIS — Z800 Family history of malignant neoplasm of digestive organs: Secondary | ICD-10-CM

## 2017-07-21 NOTE — Progress Notes (Signed)
No egg or soy allergy known to patient  No issues with past sedation with any surgeries  or procedures, no intubation problems  No diet pills per patient No home 02 use per patient  No blood thinners per patient  Pt denies issues with constipation  No A fib or A flutter  EMMI video sent to pt's e mail  

## 2017-08-04 ENCOUNTER — Other Ambulatory Visit: Payer: Self-pay

## 2017-08-04 ENCOUNTER — Ambulatory Visit (AMBULATORY_SURGERY_CENTER): Payer: Managed Care, Other (non HMO) | Admitting: Internal Medicine

## 2017-08-04 ENCOUNTER — Encounter: Payer: Self-pay | Admitting: Internal Medicine

## 2017-08-04 VITALS — BP 140/95 | HR 70 | Temp 99.1°F | Resp 15 | Ht 70.0 in | Wt 235.0 lb

## 2017-08-04 DIAGNOSIS — Z800 Family history of malignant neoplasm of digestive organs: Secondary | ICD-10-CM

## 2017-08-04 DIAGNOSIS — Z1211 Encounter for screening for malignant neoplasm of colon: Secondary | ICD-10-CM | POA: Diagnosis not present

## 2017-08-04 MED ORDER — SODIUM CHLORIDE 0.9 % IV SOLN
500.0000 mL | Freq: Once | INTRAVENOUS | Status: DC
Start: 2017-08-04 — End: 2017-12-08

## 2017-08-04 NOTE — Op Note (Signed)
[facility] Patient Name: Timothy Jennings Procedure Date: 08/04/2017 10:07 AM MRN: [id number] Endoscopist: Iva Booparl E Tauna Macfarlane , MD Age: [age] Referring MD:  Date of Birth: 1965/05/22 Gender: Male Account #: [id number] Procedure:                Colonoscopy Indications:              Screening in patient at increased risk: Colorectal                            cancer in mother 5060 or older or older Medicines:                Propofol per Anesthesia, Monitored Anesthesia Care Procedure:                Pre-Anesthesia Assessment:                           - Prior to the procedure, a History and Physical                            was performed, and patient medications and                            allergies were reviewed. The patient's tolerance of                            previous anesthesia was also reviewed. The risks                            and benefits of the procedure and the sedation                            options and risks were discussed with the patient.                            All questions were answered, and informed consent                            was obtained. Prior Anticoagulants: The patient has                            taken no previous anticoagulant or antiplatelet                            agents. ASA Grade Assessment: II - A patient with                            mild systemic disease. After reviewing the risks                            and benefits, the patient was deemed in                            satisfactory condition to undergo the procedure.  After obtaining informed consent, the colonoscope                            was passed under direct vision. Throughout the                            procedure, the patient's blood pressure, pulse, and                            oxygen saturations were monitored continuously. The                            Colonoscope was introduced through the anus and                            advanced  to the the cecum, identified by                            appendiceal orifice and ileocecal valve. The                            colonoscopy was performed without difficulty. The                            patient tolerated the procedure well. The quality                            of the bowel preparation was adequate. The                            ileocecal valve, appendiceal orifice, and rectum                            were photographed. The bowel preparation used was                            Miralax. Scope In: 10:17:58 AM Scope Out: 10:32:11 AM Scope Withdrawal Time: 0 hours 10 minutes 48 seconds  Total Procedure Duration: 0 hours 14 minutes 13 seconds  Findings:                 The perianal and digital rectal examinations were                            normal. Pertinent negatives include normal prostate                            (size, shape, and consistency).                           Multiple diverticula were found in the sigmoid                            colon.  The exam was otherwise without abnormality on                            direct and retroflexion views. Complications:            No immediate complications. Estimated Blood Loss:     Estimated blood loss: none. Impression:               - Diverticulosis in the sigmoid colon.                           - The examination was otherwise normal on direct                            and retroflexion views.                           - No specimens collected. Recommendation:           - Patient has a contact number available for                            emergencies. The signs and symptoms of potential                            delayed complications were discussed with the                            patient. Return to normal activities tomorrow.                            Written discharge instructions were provided to the                            patient.                           - Resume  previous diet.                           - Continue present medications.                           - Repeat colonoscopy in 5 years for screening                            purposes. Mom dx at 31 - not far from 87 so will                            recall at 7 Iva Boop, MD 08/04/2017 10:38:22 AM This report has been signed electronically.

## 2017-08-04 NOTE — Progress Notes (Signed)
Report to PACU, RN, vss, BBS= Clear.  

## 2017-08-04 NOTE — Patient Instructions (Addendum)
No polyps or cancer seen.  You do have diverticulosis - thickened muscle rings and pouches in the colon wall. Please read the handout about this condition.  The guidelines say if family member was diagnosed with colon cancer less than 60 you are at higher risk.  This is always trick - I plan to notify you in 5 years as your mom was 5565 - not that far from 60.  I appreciate the opportunity to care for you. Iva Booparl E. Mirtie Bastyr, MD, [facility]  Handouts given : Diverticulosis   YOU HAD AN ENDOSCOPIC PROCEDURE TODAY AT THE [facility]:   Refer to the procedure report that was given to you for any specific questions about what was found during the examination.  If the procedure report does not answer your questions, please call your gastroenterologist to clarify.  If you requested that your care partner not be given the details of your procedure findings, then the procedure report has been included in a sealed envelope for you to review at your convenience later.  YOU SHOULD EXPECT: Some feelings of bloating in the abdomen. Passage of more gas than usual.  Walking can help get rid of the air that was put into your GI tract during the procedure and reduce the bloating. If you had a lower endoscopy (such as a colonoscopy or flexible sigmoidoscopy) you may notice spotting of blood in your stool or on the toilet paper. If you underwent a bowel prep for your procedure, you may not have a normal bowel movement for a few days.  Please Note:  You might notice some irritation and congestion in your nose or some drainage.  This is from the oxygen used during your procedure.  There is no need for concern and it should clear up in a day or so.  SYMPTOMS TO REPORT IMMEDIATELY:   Following lower endoscopy (colonoscopy or flexible sigmoidoscopy):  Excessive amounts of blood in the stool  Significant tenderness or worsening of abdominal pains  Swelling of the abdomen that is new, acute  Fever of  100F or higher   For urgent or emergent issues, a gastroenterologist can be reached at any hour by calling [phone].   DIET:  We do recommend a small meal at first, but then you may proceed to your regular diet.  Drink plenty of fluids but you should avoid alcoholic beverages for 24 hours.  ACTIVITY:  You should plan to take it easy for the rest of today and you should NOT DRIVE or use heavy machinery until tomorrow (because of the sedation medicines used during the test).    FOLLOW UP: Our staff will call the number listed on your records the next business day following your procedure to check on you and address any questions or concerns that you may have regarding the information given to you following your procedure. If we do not reach you, we will leave a message.  However, if you are feeling well and you are not experiencing any problems, there is no need to return our call.  We will assume that you have returned to your regular daily activities without incident.  If any biopsies were taken you will be contacted by phone or by letter within the next 1-3 weeks.  Please call us at [phone] if you have not heard about the biopsies in 3 weeks.    SIGNATURES/CONFIDENTIALITY: You and/or your care partner have signed paperwork which will be entered into your electronic medical record.  These signatures attest to the fact that that the information above on your After Visit Summary has been reviewed and is understood.  Full responsibility of the confidentiality of this discharge information lies with you and/or your care-partner.

## 2017-08-04 NOTE — Progress Notes (Signed)
Pt's states no medical or surgical changes since previsit or office visit. 

## 2017-08-07 ENCOUNTER — Telehealth: Payer: Self-pay

## 2017-08-07 NOTE — Telephone Encounter (Signed)
  Follow up Call-  Call back number 08/04/2017  Post procedure Call Back phone  # [phone]  Permission to leave phone message Yes  Some recent data might be hidden     Patient questions:  Do you have a fever, pain , or abdominal swelling? Yes.   Pain Score  0 *  Have you tolerated food without any problems? Yes.    Have you been able to return to your normal activities? Yes.    Do you have any questions about your discharge instructions: Diet   No. Medications  No. Follow up visit  No.  Do you have questions or concerns about your Care? No.  Actions: * If pain score is 4 or above: No action needed, pain <4.

## 2017-11-20 NOTE — Progress Notes (Signed)
HPI:  Using dictation device. Unfortunately this device frequently misinterprets words/phrases.  Timothy Jennings is a pleasant 52 y.o. here for follow up. Chronic medical problems summarized below were reviewed for changes and stability and were updated as needed below. These issues and their treatment remain stable for the most part. Reports is doing great. Has been walking daily and has health coach at work to assist with diet. Has lost 12 lbs since the last visit. Had been taking pseudoephedrine this week for sinus issues, now better. Denies CP, SOB, DOE, treatment intolerance or new symptoms.  HLD/Obesity/hyperglycemia: -meds: pravastatin -wt 238 (1/19) --> 222 (7/19)  Allergic Rhinitis: -saw allergist in the past -takes claritin and benadryl  ED: -meds: viagra prn -requests refill  ROS: See pertinent positives and negatives per HPI.  Past Medical History:  Diagnosis Date  . Allergy   . Chicken pox   . Hyperlipidemia 02/21/2012  . 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 Otitis media    followed by ENT   . Trigger finger, acquired 07/01/2015   Involves 5th MCP on RT with nodule noted on [place]  Thumb may rarely trigger     Past Surgical History:  Procedure Laterality Date  . BILATERAL CARPAL TUNNEL RELEASE    . EYE SURGERY    . HEMORROIDECTOMY    . NECK SURGERY  2007  . SPINE SURGERY    . TYMPANOSTOMY TUBE PLACEMENT    . VASECTOMY      Family History  Problem Relation Age of Onset  . Colon cancer Mother 50       dx at 67  . Hyperlipidemia Father   . Hypertension Paternal Grandmother   . Heart disease Paternal Grandmother   . Hypertension Paternal Grandfather   . Heart disease Paternal Grandfather   . Alcohol abuse Unknown        Grandparent  . Colon polyps Neg Hx   . Esophageal cancer Neg Hx   . Rectal cancer Neg Hx   . Stomach cancer Neg Hx     SOCIAL HX: see hpi   Current Outpatient Medications:  .   DiphenhydrAMINE HCl (BENADRYL ALLERGY PO), Take by mouth as needed., Disp: , Rfl:  .  loratadine (CLARITIN) 10 MG tablet, Take 10 mg by mouth daily., Disp: , Rfl:  .  pravastatin (PRAVACHOL) 40 MG tablet, TAKE 1 TABLET BY MOUTH DAILY, Disp: 90 tablet, Rfl: 2 .  sildenafil (VIAGRA) 25 MG tablet, TAKE 1 TO 2 TABLETS BY MOUTH AS NEEDED FOR SEXUAL ACTIVITY., Disp: 10 tablet, Rfl: 3  Current Facility-Administered Medications:  .  0.9 %  sodium chloride infusion, 500 mL, Intravenous, Once, Iva Boop, MD .  albuterol (PROVENTIL) (2.5 MG/3ML) 0.083% nebulizer solution 2.5 mg, 2.5 mg, Nebulization, Once, Rhoderick Moody M, PA-C  EXAM:  Vitals:   11/23/17 0757  BP: 118/84  Pulse: 70  Temp: 98.5 F (36.9 C)    Body mass index is 31.88 kg/m.  GENERAL: vitals reviewed and listed above, alert, oriented, appears well hydrated and in no acute distress  HEENT: atraumatic, conjunttiva clear, no obvious abnormalities on inspection of external nose and ears  NECK: no obvious masses on inspection  LUNGS: clear to auscultation bilaterally, no wheezes, rales or rhonchi, good air movement  CV: HRRR, no peripheral edema  MS: moves all extremities without noticeable abnormality  PSYCH: pleasant and cooperative, no obvious depression or anxiety  ASSESSMENT AND PLAN:  Discussed  the following assessment and plan:  Elevated blood pressure reading  Hyperglycemia  Hyperlipidemia, unspecified hyperlipidemia type  Obesity (BMI 30.0-34.9)  -congratulated on weight reduction and encouraged to continue healthy diet and regular exercise -BP likely us from cold medication, advised to stop, continue lifestyle changes and wt reduction and recheck next visit - denies supplement use -labs at CPE -refill provided at his request -Patient advised to return sooner if new concerns arise.  Patient Instructions  Follow up: -CPE in January   We recommend the following healthy lifestyle for LIFE: 1)  Small portions. But, make sure to get regular (at least 3 per day), healthy meals and small healthy snacks if needed.  2) Eat a healthy clean diet.   TRY TO EAT: -at least 5-7 servings of low sugar, colorful, and nutrient rich vegetables per day (not corn, potatoes or bananas.) -berries are the best choice if you wish to eat fruit (only eat small amounts if trying to reduce weight)  -lean meets (fish, white meat of chicken or [place]) -vegan proteins for some meals - beans or tofu, whole grains, nuts and seeds -Replace bad fats with good fats - good fats include: fish, nuts and seeds, canola oil, olive oil -small amounts of low fat or non fat dairy -small amounts of100 % whole grains - check the lables -drink plenty of water  AVOID: -SUGAR, sweets, anything with added sugar, corn syrup or sweeteners - must read labels as even foods advertised as "healthy" often are loaded with sugar -if you must have a sweetener, small amounts of stevia may be best -sweetened beverages and artificially sweetened beverages -simple starches (rice, bread, potatoes, pasta, chips, etc - small amounts of 100% whole grains are ok) -red meat, pork, butter -fried foods, fast food, processed food, excessive dairy, eggs and coconut.  3)Get at least 150 minutes of sweaty aerobic exercise per week.  4)Reduce stress - consider counseling, meditation and relaxation to balance other aspects of your life.     Terressa KoyanagiHannah R Bardia Wangerin, DO

## 2017-11-23 ENCOUNTER — Encounter: Payer: Self-pay | Admitting: Family Medicine

## 2017-11-23 ENCOUNTER — Ambulatory Visit: Payer: Managed Care, Other (non HMO) | Admitting: Family Medicine

## 2017-11-23 VITALS — BP 118/84 | HR 70 | Temp 98.5°F | Ht 70.0 in | Wt 222.2 lb

## 2017-11-23 DIAGNOSIS — R739 Hyperglycemia, unspecified: Secondary | ICD-10-CM | POA: Diagnosis not present

## 2017-11-23 DIAGNOSIS — E785 Hyperlipidemia, unspecified: Secondary | ICD-10-CM

## 2017-11-23 DIAGNOSIS — R030 Elevated blood-pressure reading, without diagnosis of hypertension: Secondary | ICD-10-CM | POA: Diagnosis not present

## 2017-11-23 DIAGNOSIS — E669 Obesity, unspecified: Secondary | ICD-10-CM

## 2017-11-23 MED ORDER — SILDENAFIL CITRATE 25 MG PO TABS: ORAL_TABLET | ORAL | 3 refills | Status: DC

## 2017-11-23 NOTE — Patient Instructions (Signed)
Follow up: -CPE in January   We recommend the following healthy lifestyle for LIFE: 1) Small portions. But, make sure to get regular (at least 3 per day), healthy meals and small healthy snacks if needed.  2) Eat a healthy clean diet.   TRY TO EAT: -at least 5-7 servings of low sugar, colorful, and nutrient rich vegetables per day (not corn, potatoes or bananas.) -berries are the best choice if you wish to eat fruit (only eat small amounts if trying to reduce weight)  -lean meets (fish, white meat of chicken or [place]) -vegan proteins for some meals - beans or tofu, whole grains, nuts and seeds -Replace bad fats with good fats - good fats include: fish, nuts and seeds, canola oil, olive oil -small amounts of low fat or non fat dairy -small amounts of100 % whole grains - check the lables -drink plenty of water  AVOID: -SUGAR, sweets, anything with added sugar, corn syrup or sweeteners - must read labels as even foods advertised as "healthy" often are loaded with sugar -if you must have a sweetener, small amounts of stevia may be best -sweetened beverages and artificially sweetened beverages -simple starches (rice, bread, potatoes, pasta, chips, etc - small amounts of 100% whole grains are ok) -red meat, pork, butter -fried foods, fast food, processed food, excessive dairy, eggs and coconut.  3)Get at least 150 minutes of sweaty aerobic exercise per week.  4)Reduce stress - consider counseling, meditation and relaxation to balance other aspects of your life.

## 2017-11-26 ENCOUNTER — Encounter (HOSPITAL_COMMUNITY): Payer: Self-pay | Admitting: Emergency Medicine

## 2017-11-26 ENCOUNTER — Other Ambulatory Visit: Payer: Self-pay

## 2017-11-26 ENCOUNTER — Emergency Department (HOSPITAL_COMMUNITY)
Admission: EM | Admit: 2017-11-26 | Discharge: 2017-11-27 | Disposition: A | Discharge status: Home / Self Care | Payer: Managed Care, Other (non HMO) | Attending: Emergency Medicine | Admitting: Emergency Medicine

## 2017-11-26 DIAGNOSIS — S8991XA Unspecified injury of right lower leg, initial encounter: Secondary | ICD-10-CM | POA: Diagnosis present

## 2017-11-26 DIAGNOSIS — Z79899 Other long term (current) drug therapy: Secondary | ICD-10-CM | POA: Diagnosis not present

## 2017-11-26 DIAGNOSIS — Y929 Unspecified place or not applicable: Secondary | ICD-10-CM | POA: Diagnosis not present

## 2017-11-26 DIAGNOSIS — S82141A Displaced bicondylar fracture of right tibia, initial encounter for closed fracture: Secondary | ICD-10-CM

## 2017-11-26 DIAGNOSIS — S82109A Unspecified fracture of upper end of unspecified tibia, initial encounter for closed fracture: Secondary | ICD-10-CM | POA: Insufficient documentation

## 2017-11-26 DIAGNOSIS — Z87891 Personal history of nicotine dependence: Secondary | ICD-10-CM | POA: Insufficient documentation

## 2017-11-26 DIAGNOSIS — Y999 Unspecified external cause status: Secondary | ICD-10-CM | POA: Insufficient documentation

## 2017-11-26 DIAGNOSIS — Y939 Activity, unspecified: Secondary | ICD-10-CM | POA: Insufficient documentation
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
## 2017-11-27 ENCOUNTER — Emergency Department (HOSPITAL_COMMUNITY): Payer: Managed Care, Other (non HMO)

## 2017-11-27 MED ORDER — OXYCODONE-ACETAMINOPHEN 5-325 MG PO TABS: 1.0000 | ORAL_TABLET | Freq: Four times a day (QID) | ORAL | 0 refills | Status: DC | PRN

## 2017-11-27 MED ORDER — IBUPROFEN 600 MG PO TABS: 600.0000 mg | ORAL_TABLET | Freq: Four times a day (QID) | ORAL | 0 refills | Status: DC | PRN

## 2017-11-27 MED ORDER — OXYCODONE-ACETAMINOPHEN 5-325 MG PO TABS
1.0000 | ORAL_TABLET | Freq: Once | ORAL | Status: AC
  Administered 2017-11-27: 1 via ORAL
  Filled 2017-11-27: qty 1

## 2017-11-27 NOTE — Discharge Instructions (Signed)
Medications: Ibuprofen, Percocet  Treatment: Take ibuprofen as prescribed as needed for mild to moderate pain.  For severe pain, take 1-2 Percocet every 6 hours as needed.  Use ice alternating 20 minutes on, 20 minutes off.  Use crutches at all times, not bearing any weight on your right leg.  Use knee immobilizer at all times except when bathing.  Follow-up: Please follow-up with Dr. Aundria Rudogers, the orthopedic doctor, this week for further evaluation and treatment of your tibial plateau fracture.  Please return the emergency department if you develop any new or worsening symptoms including complete numbness of your foot, or any other new or concerning symptoms.

## 2017-11-27 NOTE — Progress Notes (Signed)
Orthopedic Tech Progress Note Patient Details:  Willeen CassLouis M Sidener March 04, 1966 [id number]  Ortho Devices Type of Ortho Device: Crutches, Knee Immobilizer Ortho Device/Splint Location: rle Ortho Device/Splint Interventions: Ordered, Application, Adjustment   Post Interventions Patient Tolerated: Well Instructions Provided: Care of device, Adjustment of device   Trinna PostMartinez, Shekela Goodridge J 11/27/2017, 3:14 AM
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
## 2017-12-06 NOTE — Pre-Procedure Instructions (Signed)
[facility]  12/06/2017      [facility] Phone: [phone] Fax: [phone]    Your procedure is scheduled on Aug. 9  [facility] at 9:30 A.M.  Call this number if you have problems the morning of surgery:  [phone]   Remember:  Do not eat or drink after midnight.      Take these medicines the morning of surgery with A SIP OF WATER :               Benadryl if needed             Loratadine(clairtin)             Oxycodone if needed            7 days prior to surgery STOP taking any Aspirin(unless otherwise instructed by your surgeon), Aleve, Naproxen, Ibuprofen, Motrin, Advil, Goody's, BC's, all herbal medications, fish oil, and all vitamins    Do not wear jewelry.  Do not wear lotions, powders, or perfumes, or deodorant.  Do not shave 48 hours prior to surgery.  Men may shave face and neck.  Do not bring valuables to the hospital.  [facility] is not responsible for any belongings or valuables.  Contacts, dentures or bridgework may not be worn into surgery.  Leave your suitcase in the car.  After surgery it may be brought to your room.  For patients admitted to the hospital, discharge time will be determined by your treatment team.  Patients discharged the day of surgery will not be allowed to drive home.    Special instructions:   [place]- Preparing For Surgery  Before surgery, you can play an important role. Because skin is not sterile, your skin needs to be as free of germs as possible. You can reduce the number of germs on your skin by washing with CHG (chlorahexidine gluconate) Soap before surgery.  CHG is an antiseptic cleaner which kills germs and bonds with the skin to continue killing germs even after washing.    Oral Hygiene is also important to reduce your risk of infection.  Remember -  BRUSH YOUR TEETH THE MORNING OF SURGERY WITH YOUR REGULAR TOOTHPASTE  Please do not use if you have an allergy to CHG or antibacterial soaps. If your skin becomes reddened/irritated stop using the CHG.  Do not shave (including legs and underarms) for at least 48 hours prior to first CHG shower. It is OK to shave your face.  Please follow these instructions carefully.   1. Shower the NIGHT BEFORE SURGERY and the MORNING OF SURGERY with CHG.   2. If you chose to wash your hair, wash your hair first as usual with your normal shampoo.  3. After you shampoo, rinse your hair and body thoroughly to remove the shampoo.  4. Use CHG as you would any other liquid soap. You can apply CHG directly to the skin and wash gently with a scrungie or a clean washcloth.   5. Apply the CHG Soap to your body ONLY FROM THE NECK DOWN.  Do not use on open wounds or open sores. Avoid contact with your eyes, ears, mouth and genitals (private parts). Wash Face and genitals (private parts)  with your normal soap.  6. Wash thoroughly, paying special attention to the area where your  surgery will be performed.  7. Thoroughly rinse your body with warm water from the neck down.  8. DO NOT shower/wash with your normal soap after using and rinsing off the CHG Soap.  9. Pat yourself dry with a CLEAN TOWEL.  10. Wear CLEAN PAJAMAS to bed the night before surgery, wear comfortable clothes the morning of surgery  11. Place CLEAN SHEETS on your bed the night of your first shower and DO NOT SLEEP WITH PETS.    Day of Surgery:  Do not apply any deodorants/lotions.  Please wear clean clothes to the hospital/surgery center.   Remember to brush your teeth WITH YOUR REGULAR TOOTHPASTE.    Please read over the following fact sheets that you were given. Coughing and Deep Breathing and Surgical Site Infection Prevention

## 2017-12-07 ENCOUNTER — Other Ambulatory Visit: Payer: Self-pay

## 2017-12-07 ENCOUNTER — Encounter (HOSPITAL_COMMUNITY): Payer: Self-pay

## 2017-12-07 ENCOUNTER — Encounter (HOSPITAL_COMMUNITY)
Admission: RE | Admit: 2017-12-07 | Discharge: 2017-12-07 | Disposition: A | Discharge status: Home / Self Care | Payer: Managed Care, Other (non HMO) | Source: Ambulatory Visit | Attending: Orthopedic Surgery | Admitting: Orthopedic Surgery

## 2017-12-07 DIAGNOSIS — S82141A Displaced bicondylar fracture of right tibia, initial encounter for closed fracture: Secondary | ICD-10-CM | POA: Diagnosis not present

## 2017-12-07 DIAGNOSIS — E785 Hyperlipidemia, unspecified: Secondary | ICD-10-CM | POA: Diagnosis not present

## 2017-12-07 DIAGNOSIS — Z79899 Other long term (current) drug therapy: Secondary | ICD-10-CM | POA: Diagnosis not present

## 2017-12-07 DIAGNOSIS — Y92488 Other paved roadways as the place of occurrence of the external cause: Secondary | ICD-10-CM | POA: Diagnosis not present

## 2017-12-07 DIAGNOSIS — Z87891 Personal history of nicotine dependence: Secondary | ICD-10-CM | POA: Diagnosis not present

## 2017-12-07 DIAGNOSIS — I10 Essential (primary) hypertension: Secondary | ICD-10-CM | POA: Diagnosis not present

## 2017-12-07 LAB — BASIC METABOLIC PANEL
Anion gap: 10 (ref 5–15)
BUN: 15 mg/dL (ref 6–20)
CALCIUM: 9.3 mg/dL (ref 8.9–10.3)
CO2: 28 mmol/L (ref 22–32)
CREATININE: 0.97 mg/dL (ref 0.61–1.24)
Chloride: 102 mmol/L (ref 98–111)
Glucose, Bld: 102 mg/dL — ABNORMAL HIGH (ref 70–99)
Potassium: 3.8 mmol/L (ref 3.5–5.1)
Sodium: 140 mmol/L (ref 135–145)

## 2017-12-07 LAB — CBC
HEMATOCRIT: 39.3 % (ref 39.0–52.0)
Hemoglobin: 12.8 g/dL — ABNORMAL LOW (ref 13.0–17.0)
MCH: 29.8 pg (ref 26.0–34.0)
MCHC: 32.6 g/dL (ref 30.0–36.0)
MCV: 91.4 fL (ref 78.0–100.0)
PLATELETS: 286 10*3/uL (ref 150–400)
RBC: 4.3 MIL/uL (ref 4.22–5.81)
RDW: 12.7 % (ref 11.5–15.5)
WBC: 9.8 10*3/uL (ref 4.0–10.5)

## 2017-12-08 ENCOUNTER — Ambulatory Visit (HOSPITAL_COMMUNITY): Payer: Managed Care, Other (non HMO) | Admitting: Anesthesiology

## 2017-12-08 ENCOUNTER — Encounter (HOSPITAL_COMMUNITY): Payer: Self-pay | Admitting: *Deleted

## 2017-12-08 ENCOUNTER — Encounter (HOSPITAL_COMMUNITY)
Admission: RE | Disposition: A | Discharge status: Home / Self Care | Payer: Self-pay | Source: Ambulatory Visit | Attending: Orthopedic Surgery

## 2017-12-08 ENCOUNTER — Ambulatory Visit (HOSPITAL_COMMUNITY)
Admission: RE | Admit: 2017-12-08 | Discharge: 2017-12-08 | Disposition: A | Discharge status: Home / Self Care | Payer: Managed Care, Other (non HMO) | Source: Ambulatory Visit | Attending: Orthopedic Surgery | Admitting: Orthopedic Surgery

## 2017-12-08 ENCOUNTER — Ambulatory Visit (HOSPITAL_COMMUNITY): Payer: Managed Care, Other (non HMO)

## 2017-12-08 DIAGNOSIS — S82141A Displaced bicondylar fracture of right tibia, initial encounter for closed fracture: Secondary | ICD-10-CM | POA: Insufficient documentation

## 2017-12-08 DIAGNOSIS — I10 Essential (primary) hypertension: Secondary | ICD-10-CM | POA: Insufficient documentation

## 2017-12-08 DIAGNOSIS — T1490XA Injury, unspecified, initial encounter: Secondary | ICD-10-CM

## 2017-12-08 DIAGNOSIS — Y92488 Other paved roadways as the place of occurrence of the external cause: Secondary | ICD-10-CM | POA: Insufficient documentation

## 2017-12-08 DIAGNOSIS — Z79899 Other long term (current) drug therapy: Secondary | ICD-10-CM | POA: Insufficient documentation

## 2017-12-08 DIAGNOSIS — S82121A Displaced fracture of lateral condyle of right tibia, initial encounter for closed fracture: Secondary | ICD-10-CM | POA: Diagnosis present

## 2017-12-08 DIAGNOSIS — Z87891 Personal history of nicotine dependence: Secondary | ICD-10-CM | POA: Insufficient documentation

## 2017-12-08 DIAGNOSIS — E785 Hyperlipidemia, unspecified: Secondary | ICD-10-CM | POA: Insufficient documentation

## 2017-12-08 HISTORY — PX: ORIF TIBIA PLATEAU: SHX2132

## 2017-12-08 SURGERY — OPEN REDUCTION INTERNAL FIXATION (ORIF) TIBIAL PLATEAU
Anesthesia: General | Site: Knee | Laterality: Right

## 2017-12-08 MED ORDER — OXYCODONE HCL 5 MG PO TABS
5.0000 mg | ORAL_TABLET | Freq: Once | ORAL | Status: AC | PRN
  Administered 2017-12-08: 5 mg via ORAL

## 2017-12-08 MED ORDER — ONDANSETRON HCL 4 MG/2ML IJ SOLN: 4.0000 mg | Freq: Once | INTRAMUSCULAR | Status: DC | PRN

## 2017-12-08 MED ORDER — FENTANYL CITRATE (PF) 100 MCG/2ML IJ SOLN
25.0000 ug | INTRAMUSCULAR | Status: DC | PRN
  Administered 2017-12-08: 50 ug via INTRAVENOUS

## 2017-12-08 MED ORDER — CHLORHEXIDINE GLUCONATE 4 % EX LIQD: 60.0000 mL | Freq: Once | CUTANEOUS | Status: DC

## 2017-12-08 MED ORDER — BUPIVACAINE HCL (PF) 0.25 % IJ SOLN
INTRAMUSCULAR | Status: AC
  Filled 2017-12-08: qty 30

## 2017-12-08 MED ORDER — OXYCODONE HCL 5 MG PO TABS: 5.0000 mg | ORAL_TABLET | ORAL | 0 refills | Status: DC | PRN

## 2017-12-08 MED ORDER — LACTATED RINGERS IV SOLN
INTRAVENOUS | Status: DC
  Administered 2017-12-08: 10:00:00 via INTRAVENOUS

## 2017-12-08 MED ORDER — FENTANYL CITRATE (PF) 250 MCG/5ML IJ SOLN
INTRAMUSCULAR | Status: AC
  Filled 2017-12-08: qty 5

## 2017-12-08 MED ORDER — ONDANSETRON 4 MG PO TBDP: 4.0000 mg | ORAL_TABLET | Freq: Three times a day (TID) | ORAL | 0 refills | Status: DC | PRN

## 2017-12-08 MED ORDER — FENTANYL CITRATE (PF) 100 MCG/2ML IJ SOLN
100.0000 ug | Freq: Once | INTRAMUSCULAR | Status: AC
  Administered 2017-12-08: 100 ug via INTRAVENOUS

## 2017-12-08 MED ORDER — ROCURONIUM BROMIDE 50 MG/5ML IV SOSY
PREFILLED_SYRINGE | INTRAVENOUS | Status: DC | PRN
  Administered 2017-12-08: 60 mg via INTRAVENOUS

## 2017-12-08 MED ORDER — MEPERIDINE HCL 50 MG/ML IJ SOLN: 6.2500 mg | INTRAMUSCULAR | Status: DC | PRN

## 2017-12-08 MED ORDER — MIDAZOLAM HCL 2 MG/2ML IJ SOLN
INTRAMUSCULAR | Status: AC
  Administered 2017-12-08: 2 mg via INTRAVENOUS
  Filled 2017-12-08: qty 2

## 2017-12-08 MED ORDER — LIDOCAINE 2% (20 MG/ML) 5 ML SYRINGE
INTRAMUSCULAR | Status: DC | PRN
  Administered 2017-12-08: 40 mg via INTRAVENOUS

## 2017-12-08 MED ORDER — MIDAZOLAM HCL 2 MG/2ML IJ SOLN
INTRAMUSCULAR | Status: AC
  Filled 2017-12-08: qty 2

## 2017-12-08 MED ORDER — MIDAZOLAM HCL 5 MG/5ML IJ SOLN
INTRAMUSCULAR | Status: DC | PRN
  Administered 2017-12-08: 2 mg via INTRAVENOUS

## 2017-12-08 MED ORDER — CEFAZOLIN SODIUM-DEXTROSE 2-4 GM/100ML-% IV SOLN
2.0000 g | INTRAVENOUS | Status: AC
  Administered 2017-12-08: 2 g via INTRAVENOUS
  Filled 2017-12-08: qty 100

## 2017-12-08 MED ORDER — OXYCODONE HCL 5 MG/5ML PO SOLN: 5.0000 mg | Freq: Once | ORAL | Status: AC | PRN

## 2017-12-08 MED ORDER — FENTANYL CITRATE (PF) 100 MCG/2ML IJ SOLN
INTRAMUSCULAR | Status: AC
  Administered 2017-12-08: 100 ug via INTRAVENOUS
  Filled 2017-12-08: qty 2

## 2017-12-08 MED ORDER — SODIUM CHLORIDE 0.9 % IV SOLN
INTRAVENOUS | Status: DC | PRN
  Administered 2017-12-08: 20 ug/min via INTRAVENOUS

## 2017-12-08 MED ORDER — OXYCODONE HCL 5 MG PO TABS
ORAL_TABLET | ORAL | Status: AC
  Filled 2017-12-08: qty 1

## 2017-12-08 MED ORDER — 0.9 % SODIUM CHLORIDE (POUR BTL) OPTIME
TOPICAL | Status: DC | PRN
  Administered 2017-12-08: 1000 mL

## 2017-12-08 MED ORDER — ACETAMINOPHEN 160 MG/5ML PO SOLN: 325.0000 mg | ORAL | Status: DC | PRN

## 2017-12-08 MED ORDER — ACETAMINOPHEN 325 MG PO TABS: 325.0000 mg | ORAL_TABLET | ORAL | Status: DC | PRN

## 2017-12-08 MED ORDER — ONDANSETRON HCL 4 MG/2ML IJ SOLN
INTRAMUSCULAR | Status: DC | PRN
  Administered 2017-12-08: 4 mg via INTRAVENOUS

## 2017-12-08 MED ORDER — DEXAMETHASONE SODIUM PHOSPHATE 10 MG/ML IJ SOLN
INTRAMUSCULAR | Status: DC | PRN
  Administered 2017-12-08: 10 mg via INTRAVENOUS

## 2017-12-08 MED ORDER — IBUPROFEN 600 MG PO TABS: 600.0000 mg | ORAL_TABLET | Freq: Four times a day (QID) | ORAL | 0 refills | Status: DC | PRN

## 2017-12-08 MED ORDER — PROPOFOL 10 MG/ML IV BOLUS
INTRAVENOUS | Status: DC | PRN
  Administered 2017-12-08: 200 mg via INTRAVENOUS

## 2017-12-08 MED ORDER — FENTANYL CITRATE (PF) 100 MCG/2ML IJ SOLN
INTRAMUSCULAR | Status: AC
  Administered 2017-12-08: 50 ug via INTRAVENOUS
  Filled 2017-12-08: qty 2

## 2017-12-08 MED ORDER — ROPIVACAINE HCL 7.5 MG/ML IJ SOLN
INTRAMUSCULAR | Status: DC | PRN
  Administered 2017-12-08: 25 mL via PERINEURAL

## 2017-12-08 MED ORDER — BUPIVACAINE HCL (PF) 0.25 % IJ SOLN
INTRAMUSCULAR | Status: DC | PRN
  Administered 2017-12-08: 20 mL

## 2017-12-08 MED ORDER — MIDAZOLAM HCL 2 MG/2ML IJ SOLN
2.0000 mg | Freq: Once | INTRAMUSCULAR | Status: AC
  Administered 2017-12-08: 2 mg via INTRAVENOUS

## 2017-12-08 MED ORDER — SUGAMMADEX SODIUM 200 MG/2ML IV SOLN
INTRAVENOUS | Status: DC | PRN
  Administered 2017-12-08: 194.2 mg via INTRAVENOUS

## 2017-12-08 SURGICAL SUPPLY — 72 items
ALCOHOL 70% 16 OZ (MISCELLANEOUS) ×3 IMPLANT
BANDAGE ACE 6X5 VEL STRL LF (GAUZE/BANDAGES/DRESSINGS) ×3 IMPLANT
BANDAGE ELASTIC 6 VELCRO ST LF (GAUZE/BANDAGES/DRESSINGS) ×3 IMPLANT
BANDAGE ESMARK 6X9 LF (GAUZE/BANDAGES/DRESSINGS) ×1 IMPLANT
BIT DRILL 2.5 X LONG (BIT) ×1
BIT DRILL PERC QC 2.8X200 100 (BIT) ×1 IMPLANT
BIT DRILL X LONG 2.5 (BIT) ×1 IMPLANT
BLADE CLIPPER SURG (BLADE) IMPLANT
BNDG CMPR 9X6 STRL LF SNTH (GAUZE/BANDAGES/DRESSINGS) ×1
BNDG COHESIVE 6X5 TAN STRL LF (GAUZE/BANDAGES/DRESSINGS) ×3 IMPLANT
BNDG ESMARK 6X9 LF (GAUZE/BANDAGES/DRESSINGS) ×3
COVER SURGICAL LIGHT HANDLE (MISCELLANEOUS) ×3 IMPLANT
DECANTER SPIKE VIAL GLASS SM (MISCELLANEOUS) ×3 IMPLANT
DRAPE C-ARM 42X72 X-RAY (DRAPES) ×3 IMPLANT
DRAPE C-ARMOR (DRAPES) ×3 IMPLANT
DRAPE HALF SHEET 40X57 (DRAPES) ×3 IMPLANT
DRAPE IMP U-DRAPE 54X76 (DRAPES) ×6 IMPLANT
DRAPE INCISE IOBAN 66X45 STRL (DRAPES) ×3 IMPLANT
DRAPE ORTHO SPLIT 77X108 STRL (DRAPES) ×6
DRAPE SURG ORHT 6 SPLT 77X108 (DRAPES) ×2 IMPLANT
DRAPE U-SHAPE 47X51 STRL (DRAPES) ×3 IMPLANT
DRILL BIT QUICK COUP 2.8MM 100 (BIT) ×2
DRILL BIT X LONG 2.5 (BIT) ×2
DRSG PAD ABDOMINAL 8X10 ST (GAUZE/BANDAGES/DRESSINGS) ×6 IMPLANT
DURAPREP 26ML APPLICATOR (WOUND CARE) ×3 IMPLANT
ELECT REM PT RETURN 9FT ADLT (ELECTROSURGICAL) ×3
ELECTRODE REM PT RTRN 9FT ADLT (ELECTROSURGICAL) ×1 IMPLANT
FACESHIELD STD STERILE (MASK) ×3 IMPLANT
GAUZE SPONGE 4X4 12PLY STRL (GAUZE/BANDAGES/DRESSINGS) ×3 IMPLANT
GAUZE SPONGE 4X4 12PLY STRL LF (GAUZE/BANDAGES/DRESSINGS) ×3 IMPLANT
GAUZE XEROFORM 1X8 LF (GAUZE/BANDAGES/DRESSINGS) ×3 IMPLANT
GAUZE XEROFORM 5X9 LF (GAUZE/BANDAGES/DRESSINGS) ×3 IMPLANT
GLOVE BIO SURGEON STRL SZ7.5 (GLOVE) ×3 IMPLANT
GLOVE BIOGEL PI IND STRL 7.0 (GLOVE) ×1 IMPLANT
GLOVE BIOGEL PI IND STRL 8 (GLOVE) ×1 IMPLANT
GLOVE BIOGEL PI INDICATOR 7.0 (GLOVE) ×2
GLOVE BIOGEL PI INDICATOR 8 (GLOVE) ×2
GLOVE ECLIPSE 8.0 STRL XLNG CF (GLOVE) ×6 IMPLANT
GLOVE SURG SS PI 7.0 STRL IVOR (GLOVE) ×3 IMPLANT
GOWN STRL REUS W/ TWL LRG LVL3 (GOWN DISPOSABLE) ×2 IMPLANT
GOWN STRL REUS W/ TWL XL LVL3 (GOWN DISPOSABLE) ×1 IMPLANT
GOWN STRL REUS W/TWL LRG LVL3 (GOWN DISPOSABLE) ×6
GOWN STRL REUS W/TWL XL LVL3 (GOWN DISPOSABLE) ×3
K-WIRE 1.6X150 (WIRE) ×3
KIT BASIN OR (CUSTOM PROCEDURE TRAY) ×3 IMPLANT
KIT TURNOVER KIT B (KITS) ×3 IMPLANT
KWIRE 1.6X150 (WIRE) ×1 IMPLANT
MANIFOLD NEPTUNE II (INSTRUMENTS) ×3 IMPLANT
NDL SUT 6 .5 CRC .975X.05 MAYO (NEEDLE) IMPLANT
NEEDLE MAYO TAPER (NEEDLE)
NS IRRIG 1000ML POUR BTL (IV SOLUTION) ×3 IMPLANT
PACK GENERAL/GYN (CUSTOM PROCEDURE TRAY) ×3 IMPLANT
PAD ABD 8X10 STRL (GAUZE/BANDAGES/DRESSINGS) ×3 IMPLANT
PAD ARMBOARD 7.5X6 YLW CONV (MISCELLANEOUS) ×6 IMPLANT
PLATE PROX 3.5 VA LCP LG 4H RT (Plate) ×3 IMPLANT
SCREW CORT HEADED ST 3.5X44 (Screw) ×3 IMPLANT
SCREW HEADED ST 3.5X46 (Screw) ×3 IMPLANT
SCREW HEADED ST 3.5X50 (Screw) ×6 IMPLANT
SCREW HEADED ST 3.5X95 (Screw) ×3 IMPLANT
SCREW LOCKING 3.5X90MM VA (Screw) ×3 IMPLANT
SCREW LOCKING VA 3.5X85MM (Screw) ×9 IMPLANT
STAPLER VISISTAT 35W (STAPLE) ×3 IMPLANT
SUT ETHILON 2 0 FS 18 (SUTURE) IMPLANT
SUT ETHILON 3 0 PS 1 (SUTURE) IMPLANT
SUT PDS AB 0 CT 36 (SUTURE) IMPLANT
SUT PDS AB 1 CT  36 (SUTURE) ×2
SUT PDS AB 1 CT 36 (SUTURE) ×1 IMPLANT
SUT VIC AB 0 CT1 27 (SUTURE)
SUT VIC AB 0 CT1 27XBRD ANBCTR (SUTURE) IMPLANT
SUT VIC AB 2-0 CT1 27 (SUTURE)
SUT VIC AB 2-0 CT1 TAPERPNT 27 (SUTURE) IMPLANT
TOWEL OR 17X26 10 PK STRL BLUE (TOWEL DISPOSABLE) ×3 IMPLANT
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
## 2017-12-08 NOTE — Anesthesia Procedure Notes (Addendum)
Anesthesia Regional Block: Popliteal block   Pre-Anesthetic Checklist: ,, timeout performed, Correct Patient, Correct Site, Correct Laterality, Correct Procedure, Correct Position, site marked, Risks and benefits discussed,  Surgical consent,  Pre-op evaluation,  At surgeon's request and post-op pain management  Laterality: Right  Prep: chloraprep       Needles:  Injection technique: Single-shot  Needle Type: Echogenic Stimulator Needle     Needle Length: 5cm  Needle Gauge: 22     Additional Needles:   Procedures:, nerve stimulator,,, ultrasound used (permanent image in chart),,,,   Nerve Stimulator or Paresthesia:  Response: gastroc, 0.45 mA,   Additional Responses:   Narrative:  Start time: 12/08/2017 11:21 AM End time: 12/08/2017 11:28 AM Injection made incrementally with aspirations every 5 mL.  Events: injection painful,,,,,,,,,,  Performed by: Personally  Anesthesiologist: Bethena Midgetddono, Kursten Kruk, MD  Additional Notes: Functioning IV was confirmed and monitors were applied.  A 50mm 22ga Arrow echogenic stimulator needle was used. Sterile prep and drape,hand hygiene and sterile gloves were used. Ultrasound guidance: relevant anatomy identified, needle position confirmed, local anesthetic spread visualized around nerve(s)., vascular puncture avoided.  Image printed for medical record. Negative aspiration and negative test dose prior to incremental administration of local anesthetic. The patient tolerated the procedure well.

## 2017-12-08 NOTE — Discharge Instructions (Signed)
No weightbearing to the right lower extremity. -Maintain your postoperative bandage for 3 days.  You may remove it at that time and replace with daily dry dressing changes to the incision.  Do not get your wound wet until you have return for your follow-up appointment.  -It is okay to move the knee as tolerated.  He will begin physical therapy in approximately 2 weeks after surgery. -For pain take Tylenol and/or ibuprofen as needed and for breakthrough pain use oxycodone as directed. -For the prevention of blood clots take an 81 mg aspirin twice daily for 6 weeks.  -Elevate to the right lower extremity with your "toes above nose."  Throughout the day.  Apply ice to the knee liberally throughout the day for 30 minutes at a time.

## 2017-12-08 NOTE — Brief Op Note (Signed)
12/08/2017  1:05 PM  PATIENT:  Timothy [facility]  52 y.o. male  PRE-OPERATIVE DIAGNOSIS:  Right tibial plateau fracture  POST-OPERATIVE DIAGNOSIS:  Right tibial plateau fracture  PROCEDURE:  Procedure(s): OPEN REDUCTION INTERNAL FIXATION (ORIF) LATERAL TIBIAL PLATEAU (Right)  SURGEON:  Surgeon(s) and Role:    Yolonda Kida* Rogers, Jason Patrick, MD - Primary  PHYSICIAN ASSISTANT:   ASSISTANTS: April Green, RNFA   ANESTHESIA:   general with Popliteal  EBL:  100 mL   BLOOD ADMINISTERED:none  DRAINS: none   LOCAL MEDICATIONS USED:  MARCAINE     SPECIMEN:  No Specimen  DISPOSITION OF SPECIMEN:  N/A  COUNTS: Correct  TOURNIQUET: Placed but not utilized  DICTATION: .Note written in EPIC  PLAN OF CARE: Discharge to home after PACU  PATIENT DISPOSITION:  PACU - hemodynamically stable.   Delay start of Pharmacological VTE agent (>24hrs) due to surgical blood loss or risk of bleeding: not applicable

## 2017-12-08 NOTE — Anesthesia Preprocedure Evaluation (Addendum)
Anesthesia Evaluation  Patient identified by MRN, date of birth, ID band Patient awake    Reviewed: Allergy & Precautions, H&P , NPO status , Patient's Chart, lab work & pertinent test results, reviewed documented beta blocker date and time   Airway Mallampati: II  TM Distance: >3 FB Neck ROM: full    Dental no notable dental hx.    Pulmonary neg pulmonary ROS, former smoker,    Pulmonary exam normal breath sounds clear to auscultation       Cardiovascular Exercise Tolerance: Good hypertension,  Rhythm:regular Rate:Normal     Neuro/Psych negative neurological ROS  negative psych ROS   GI/Hepatic negative GI ROS, Neg liver ROS,   Endo/Other  negative endocrine ROS  Renal/GU negative Renal ROS  negative genitourinary   Musculoskeletal  (+) Arthritis , Osteoarthritis,    Abdominal   Peds  Hematology negative hematology ROS (+)   Anesthesia Other Findings   Reproductive/Obstetrics negative OB ROS                            Anesthesia Physical Anesthesia Plan  ASA: II  Anesthesia Plan: General   Post-op Pain Management:    Induction: Intravenous  PONV Risk Score and Plan: 1 and Ondansetron and Treatment may vary due to age or medical condition  Airway Management Planned: LMA and Oral ETT  Additional Equipment:   Intra-op Plan:   Post-operative Plan: Extubation in OR  Informed Consent: I have reviewed the patients History and Physical, chart, labs and discussed the procedure including the risks, benefits and alternatives for the proposed anesthesia with the patient or authorized representative who has indicated his/her understanding and acceptance.   Dental Advisory Given  Plan Discussed with: CRNA, Anesthesiologist and Surgeon  Anesthesia Plan Comments: ( )       Anesthesia Quick Evaluation

## 2017-12-08 NOTE — H&P (Signed)
ORTHOPAEDIC CONSULTATION  REQUESTING PHYSICIAN: Yolonda Kida, MD  PCP:  Terressa Koyanagi, DO  Chief Complaint: Right tibial plateau fracture  HPI: Timothy Jennings is a 52 y.o. male who complains of right tibial plateau fracture.  We have seen him previously in the office where this diagnosis was made.  We discussed operative versus nonoperative management.  He elected for operative management to facilitate earlier range of motion earlier weightbearing.  He is here today for that surgery.  No new complaints at this time.  Past Medical History:  Diagnosis Date  . Allergy   . Chicken pox   . Hyperlipidemia 02/21/2012  . 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 Otitis media    followed by ENT   . Trigger finger, acquired 07/01/2015   Involves 5th MCP on RT with nodule noted on [place]  Thumb may rarely trigger    Past Surgical History:  Procedure Laterality Date  . BILATERAL CARPAL TUNNEL RELEASE    . EYE SURGERY    . HEMORROIDECTOMY    . NECK SURGERY  2007  . SPINE SURGERY    . TYMPANOSTOMY TUBE PLACEMENT    . VASECTOMY     Social History   Socioeconomic History  . Marital status: Divorced    Spouse name: Not on file  . Number of children: Not on file  . Years of education: Not on file  . Highest education level: Not on file  Occupational History  . Not on file  Social Needs  . Financial resource strain: Not on file  . Food insecurity:    Worry: Not on file    Inability: Not on file  . Transportation needs:    Medical: Not on file    Non-medical: Not on file  Tobacco Use  . Smoking status: Former Smoker    Types: E-cigarettes  . Smokeless tobacco: Never Used  Substance and Sexual Activity  . Alcohol use: Yes    Alcohol/week: 14.0 standard drinks    Types: 14 Cans of beer per week    Comment: 3-4 per week  . Drug use: No  . Sexual activity: Not on file  Lifestyle  . Physical activity:    Days per week: Not on  file    Minutes per session: Not on file  . Stress: Not on file  Relationships  . Social connections:    Talks on phone: Not on file    Gets together: Not on file    Attends religious service: Not on file    Active member of club or organization: Not on file    Attends meetings of clubs or organizations: Not on file    Relationship status: Not on file  Other Topics Concern  . Not on file  Social History Narrative  . Not on file   Family History  Problem Relation Age of Onset  . Colon cancer Mother 47       dx at 80  . Hyperlipidemia Father   . Hypertension Paternal Grandmother   . Heart disease Paternal Grandmother   . Hypertension Paternal Grandfather   . Heart disease Paternal Grandfather   . Alcohol abuse Unknown        Grandparent  . Colon polyps Neg Hx   . Esophageal cancer Neg Hx   . Rectal cancer Neg Hx   . Stomach cancer Neg Hx    No Known Allergies Prior to Admission medications  Medication Sig Start Date End Date Taking? Authorizing Provider  diphenhydrAMINE (BENADRYL ALLERGY) 25 mg capsule Take 25 mg by mouth daily as needed for allergies.    Yes [provider]  loratadine (CLARITIN) 10 MG tablet Take 10 mg by mouth daily.   Yes [provider]  naproxen sodium (ALEVE) 220 MG tablet Take 660 mg by mouth daily as needed (for pain or headache).   Yes [provider]  oxyCODONE-acetaminophen (PERCOCET/ROXICET) 5-325 MG tablet Take 1-2 tablets by mouth every 6 (six) hours as needed for severe pain. Patient taking differently: Take 1 tablet by mouth at bedtime as needed for severe pain.  11/27/17  Yes Law, Alexandra M, PA-C  pravastatin (PRAVACHOL) 40 MG tablet TAKE 1 TABLET BY MOUTH DAILY 07/03/17  Yes Kriste BasqueKim, Hannah R, DO  ibuprofen (ADVIL,MOTRIN) 600 MG tablet Take 1 tablet (600 mg total) by mouth every 6 (six) hours as needed. Patient not taking: Reported on 12/04/2017 11/27/17   Emi HolesLaw, Alexandra M, PA-C  sildenafil (VIAGRA) 25 MG tablet TAKE 1  TO 2 TABLETS BY MOUTH AS NEEDED FOR SEXUAL ACTIVITY. Patient taking differently: Take 25-50 mg by mouth as needed for erectile dysfunction.  11/23/17   Kriste BasqueKim, Hannah R, DO   No results found.  Positive ROS: All other systems have been reviewed and were otherwise negative with the exception of those mentioned in the HPI and as above.  Physical Exam: General: Alert, no acute distress Cardiovascular: No pedal edema Respiratory: No cyanosis, no use of accessory musculature GI: No organomegaly, abdomen is soft and non-tender Skin: No lesions in the area of chief complaint Neurologic: Sensation intact distally Psychiatric: Patient is competent for consent with normal mood and affect Lymphatic: No axillary or cervical lymphadenopathy    Assessment: Right Schatzker 2 tibial plateau fracture  Plan: -Plan for open reduction and internal fixation of the tibial plateau today.  We reviewed the risk and benefits of this procedure at length.  He has provided informed consent. - The risks, benefits, and alternatives were discussed with the patient. There are risks associated with the surgery including, but not limited to, problems with anesthesia (death), infection, differences in leg length/angulation/rotation, fracture of bones, loosening or failure of implants, malunion, nonunion, hematoma (blood accumulation) which may require surgical drainage, blood clots, pulmonary embolism, nerve injury (foot drop), and blood vessel injury. The patient understands these risks and elects to proceed. -Plan for discharge home from PACU unless he is having issues with pain in which case we would keep him overnight for observation.    Yolonda KidaJason Patrick Meaghen Vecchiarelli, MD Cell [phone]    12/08/2017 11:07 AM

## 2017-12-08 NOTE — Transfer of Care (Signed)
Immediate Anesthesia Transfer of Care Note  Patient: Timothy Jennings  Procedure(s) Performed: OPEN REDUCTION INTERNAL FIXATION (ORIF) LATERAL TIBIAL PLATEAU (Right Knee)  Patient Location: PACU   Anesthesia Type:GA combined with regional for post-op pain  Level of Consciousness: awake, alert  and drowsy  Airway & Oxygen Therapy: Patient Spontanous Breathing and Patient connected to nasal cannula oxygen  Post-op Assessment: Report given to RN and Post -op Vital signs reviewed and stable  Post vital signs: Reviewed and stable  Last Vitals:  Vitals Value Taken Time  BP 149/94 12/08/2017  1:13 PM  Temp    Pulse 75 12/08/2017  1:15 PM  Resp 15 12/08/2017  1:15 PM  SpO2 99 % 12/08/2017  1:15 PM  Vitals shown include unvalidated device data.  Last Pain:  Vitals:   12/08/17 1000  TempSrc:   PainSc: 1          Complications: No apparent anesthesia complications

## 2017-12-10 NOTE — Anesthesia Postprocedure Evaluation (Signed)
Anesthesia Post Note  Patient: Timothy Jennings  Procedure(s) Performed: OPEN REDUCTION INTERNAL FIXATION (ORIF) LATERAL TIBIAL PLATEAU (Right Knee)     Patient location during evaluation: PACU Anesthesia Type: General Level of consciousness: awake and alert Pain management: pain level controlled Vital Signs Assessment: post-procedure vital signs reviewed and stable Respiratory status: spontaneous breathing, nonlabored ventilation, respiratory function stable and patient connected to nasal cannula oxygen Cardiovascular status: blood pressure returned to baseline and stable Postop Assessment: no apparent nausea or vomiting Anesthetic complications: no    Last Vitals:  Vitals:   12/08/17 1438 12/08/17 1442  BP: 125/86 131/85  Pulse:  75  Resp:  13  Temp:    SpO2:  99%    Last Pain:  Vitals:   12/08/17 1442  TempSrc:   PainSc: 4                  Devyn Sheerin

## 2017-12-12 ENCOUNTER — Encounter (HOSPITAL_COMMUNITY): Payer: Self-pay | Admitting: Orthopedic Surgery

## 2018-05-30 NOTE — Progress Notes (Signed)
HPI:  Using dictation device. Unfortunately this device frequently misinterprets words/phrases.  Here for CPE:  -Concerns and/or follow up today:   Timothy Jennings is a pleasant 53 y.o.Marland Kitchen Chronic medical problems summarized below were reviewed for changes and stability and were updated as needed below. These issues and their treatment remain stable for the most part.  Unfortunately suffered tibial fx and thus diet and exercise suffered and he gained weight. Since, his wife has noticed more snoring and she thinks he stops breathing sometimes. He feels rested most days when he wakes up. Had had some sinus issues and headache the last few days. No fevers, body aches. BP up some. FH HTN and some elevation in the past. No OTC meds today. Did take some nsaids the last few days for sinus issues. Feels improving. Denies CP, SOB, DOE, treatment intolerance or new symptoms. Due for flu vaccine, labs.  HLD/Obesity/hyperglycemia: -meds: pravastatin -wt 238 (1/19) --> 222 (7/19) --> 233 (1/20)  Allergic Rhinitis: -saw allergist in the past -takes claritin and benadryl  ED: -meds: viagra prn -requests refill -Diet: variety of foods, balance and well rounded, larger portion sizes -Exercise: no regular exercise -Diabetes and Dyslipidemia Screening: fasting for labs -Hx of HTN: no -Vaccines: UTD -sexual activity: yes, male partner, no new partners -wants STI testing, Hep C screening (if born 32-1963): wants hiv, rpr with labs, declines other -FH colon or prstate ca: see FH Last colon cancer screening: utd, colonoscopy done 4/19, repeat due 5 years Last prostate ca screening: declined -Alcohol, Tobacco, drug use: see social history  Review of Systems - no reported fevers, unintentional weight loss, vision loss, hearing loss, chest pain, sob, hemoptysis, melena, hematochezia, hematuria, genital discharge, changing or concerning skin lesions, bleeding, bruising, loc, thoughts of self harm or  SI  Past Medical History:  Diagnosis Date  . Allergy   . Chicken pox   . Hyperlipidemia 02/21/2012  . 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 Otitis media    followed by ENT   . Trigger finger, acquired 07/01/2015   Involves 5th MCP on RT with nodule noted on [place]  Thumb may rarely trigger     Past Surgical History:  Procedure Laterality Date  . BILATERAL CARPAL TUNNEL RELEASE    . EYE SURGERY    . HEMORROIDECTOMY    . NECK SURGERY  2007  . ORIF TIBIA PLATEAU Right 12/08/2017   Procedure: OPEN REDUCTION INTERNAL FIXATION (ORIF) LATERAL TIBIAL PLATEAU;  Surgeon: Nicholes Stairs, MD;  Location: [place];  Service: Orthopedics;  Laterality: Right;  . SPINE SURGERY    . TYMPANOSTOMY TUBE PLACEMENT    . VASECTOMY      Family History  Problem Relation Age of Onset  . Colon cancer Mother 56       dx at 53  . Hyperlipidemia Father   . Hypertension Paternal Grandmother   . Heart disease Paternal Grandmother   . Hypertension Paternal Grandfather   . Heart disease Paternal Grandfather   . Alcohol abuse Unknown        Grandparent  . Colon polyps Neg Hx   . Esophageal cancer Neg Hx   . Rectal cancer Neg Hx   . Stomach cancer Neg Hx     Social History   Socioeconomic History  . Marital status: Divorced    Spouse name: Not on file  . Number of children: Not on file  . Years of education:  Not on file  . Highest education level: Not on file  Occupational History  . Not on file  Social Needs  . Financial resource strain: Not on file  . Food insecurity:    Worry: Not on file    Inability: Not on file  . Transportation needs:    Medical: Not on file    Non-medical: Not on file  Tobacco Use  . Smoking status: Former Smoker    Types: E-cigarettes  . Smokeless tobacco: Never Used  Substance and Sexual Activity  . Alcohol use: Yes    Alcohol/week: 14.0 standard drinks    Types: 14 Cans of beer per week    Comment: 3-4 per week   . Drug use: No  . Sexual activity: Not on file  Lifestyle  . Physical activity:    Days per week: Not on file    Minutes per session: Not on file  . Stress: Not on file  Relationships  . Social connections:    Talks on phone: Not on file    Gets together: Not on file    Attends religious service: Not on file    Active member of club or organization: Not on file    Attends meetings of clubs or organizations: Not on file    Relationship status: Not on file  Other Topics Concern  . Not on file  Social History Narrative  . Not on file     Current Outpatient Medications:  .  diphenhydrAMINE (BENADRYL ALLERGY) 25 mg capsule, Take 25 mg by mouth daily as needed for allergies. , Disp: , Rfl:  .  ibuprofen (ADVIL,MOTRIN) 600 MG tablet, Take 1 tablet (600 mg total) by mouth every 6 (six) hours as needed for moderate pain., Disp: 60 tablet, Rfl: 0 .  loratadine (CLARITIN) 10 MG tablet, Take 10 mg by mouth daily., Disp: , Rfl:  .  naproxen sodium (ALEVE) 220 MG tablet, Take 660 mg by mouth daily as needed (for pain or headache)., Disp: , Rfl:  .  pravastatin (PRAVACHOL) 40 MG tablet, TAKE 1 TABLET BY MOUTH DAILY, Disp: 90 tablet, Rfl: 2 .  sildenafil (VIAGRA) 25 MG tablet, TAKE 1 TO 2 TABLETS BY MOUTH AS NEEDED FOR SEXUAL ACTIVITY. (Patient taking differently: Take 25-50 mg by mouth as needed for erectile dysfunction. ), Disp: 10 tablet, Rfl: 3  EXAM:  Vitals:   05/31/18 0815  BP: 120/90  Pulse: 80  Temp: 98.3 F (36.8 C)  TempSrc: Oral  Weight: 233 lb 14.4 oz (106.1 kg)  Height: _0  (1.778 m)    Estimated body mass index is 33.56 kg/m as calculated from the following:   Height as of this encounter: _1  (1.778 m).   Weight as of this encounter: 233 lb 14.4 oz (106.1 kg).  GENERAL: vitals reviewed and listed below, alert, oriented, appears well hydrated and in no acute distress  HEENT: head atraumatic, PERRLA, normal appearance of eyes, ears, nose and mouth. moist mucus  membranes.  NECK: supple, no masses or lymphadenopathy  LUNGS: clear to auscultation bilaterally, no rales, rhonchi or wheeze  CV: HRRR, no peripheral edema or cyanosis, normal pedal pulses  ABDOMEN: bowel sounds normal, soft, non tender to palpation, no masses, no rebound or guarding  GU: declined  SKIN: no rash or abnormal lesions, declined full skin exam  MS: normal gait, moves all extremities normally  NEURO: normal gait, speech and thought processing grossly intact, muscle tone grossly intact throughout  PSYCH: normal affect, pleasant and  cooperative  ASSESSMENT AND PLAN:  Discussed the following assessment and plan:  PREVENTIVE EXAM: -Discussed and advised all [place] preventive services health task force level A and B recommendations for age, sex and risks. -Advised at least 150 minutes of exercise per week and a healthy diet with avoidance of (less then 1 serving per week) processed foods, white starches, red meat, fast foods and sweets and consisting of: * 5-9 servings of fresh fruits and vegetables (not corn or potatoes) *nuts and seeds, beans *olives and olive oil *lean meats such as fish and white chicken  *whole grains -labs, studies and vaccines per orders this encounter - HIV Antibody (routine testing w rflx) - RPR  2. Essential hypertension -discussed etiologies, implications, eval and treatment for elevated BP -feels likely related to [place] and unhealthy lifestyle with wt gain, plans to get back on track with healthier lifestyle and we opted to start an antihypertensive after discussion options and risks. Norvasc 68m. Labs. Follow up 1 month. - Basic metabolic panel - CBC  3. Hyperlipidemia, unspecified hyperlipidemia type -lifetyle recs - Lipid panel  4. Hyperglycemia -lifestyle recs - Hemoglobin A1c  5. Obesity (BMI 30-39.9) -wt reduction advised, lifestyle recs  6. Snoring -offered referral for sleep apnea evaluation and discussed testing, risks of  untreated disease, etc -he prefers to work on wt reduction first, agrees to let uKoreaknow if what eval and we will be happy to place referral  7. Viral upper respiratory tract infection -we discussed possible serious and likely etiologies, workup and treatment, treatment risks and return precautions -after this discussion, Wendle opted for symptomatic care for likely VURI -of course, we advised Jaidon  to return or notify a doctor immediately if symptoms worsen or persist or new concerns arise.  8. Need for immunization against influenza - Flu Vaccine QUAD 6+ mos PF IM (Fluarix Quad PF)   Patient Instructions  BEFORE YOU LEAVE: -flu shot -labs -follow up:  1 month for blood pressure, snoring, weight  START the NORVASC (Amlodipine) for blood pressure and take 1 tablet in the morning (519m once daily.  Eat a healthy low sugar diet and get regular exercise. Goal for 10-20lbs of weight reduction over the next few months. Follow up promptly if you continue to have the concerns for sleep apnea or you chang eyour mind and would like a referral for evaluation for this.  We have ordered labs or studies at this visit. It can take up to 1-2 weeks for results and processing. IF results require follow up or explanation, we will call you with instructions. Clinically stable results will be released to your [facility] HospitalIf you have not heard from usKorear cannot find your results in MY[facility] 2 weeks please contact our office at [phone] If you are not yet signed up for [facility]please consider signing up.    INSTRUCTIONS FOR UPPER RESPIRATORY INFECTION:  -plenty of rest and fluids  -nasal saline wash 2-3 times daily (use prepackaged nasal saline or bottled/distilled water if making your own)   -can use AFRIN nasal spray for drainage and nasal congestion - but do NOT use longer then 3-4 days  -can use tylenol (in no history of liver disease) as directed for aches and sorethroat  -in the winter  time, using a humidifier at night is helpful (please follow cleaning instructions)  -if you are taking a cough medication - use only as directed, may also try a teaspoon of honey to coat the throat and throat  lozenges.   -for sore throat, salt water gargles can help  -follow up if you have fevers, facial pain, tooth pain, difficulty breathing or are worsening or symptoms persist longer then expected  Upper Respiratory Infection, Adult An upper respiratory infection (URI) is also known as the common cold. It is often caused by a type of germ (virus). Colds are easily spread (contagious). You can pass it to others by kissing, coughing, sneezing, or drinking out of the same glass. Usually, you get better in 1 to 3  weeks.  However, the cough can last for even longer. HOME CARE   Only take medicine as told by your doctor. Follow instructions provided above.  Drink enough water and fluids to keep your pee (urine) clear or pale yellow.  Get plenty of rest.  Return to work when your temperature is < 100 for 24 hours or as told by your doctor. You may use a face mask and wash your hands to stop your cold from spreading. GET HELP RIGHT AWAY IF:   After the first few days, you feel you are getting worse.  You have questions about your medicine.  You have chills, shortness of breath, or red spit (mucus).  You have pain in the face for more then 1-2 days, especially when you bend forward.  You have a fever, puffy (swollen) neck, pain when you swallow, or white spots in the back of your throat.  You have a bad headache, ear pain, sinus pain, or chest pain.  You have a high-pitched whistling sound when you breathe in and out (wheezing).  You cough up blood.  You have sore muscles or a stiff neck. MAKE SURE YOU:   Understand these instructions.  Will watch your condition.  Will get help right away if you are not doing well or get worse. Document Released: 10/05/2007 Document Revised:  07/11/2011 Document Reviewed: 07/24/2013 [facility] Patient Information 2015 [place], [place]. This information is not intended to replace advice given to you by your health care provider. Make sure you discuss any questions you have with your health care provider.    Preventive Care 40-64 Years, Male Preventive care refers to lifestyle choices and visits with your health care provider that can promote health and wellness. What does preventive care include?   A yearly physical exam. This is also called an annual well check.  Dental exams once or twice a year.  Routine eye exams. Ask your health care provider how often you should have your eyes checked.  Personal lifestyle choices, including: ? Daily care of your teeth and gums. ? Regular physical activity. ? Eating a healthy diet. ? Avoiding tobacco and drug use. ? Limiting alcohol use. ? Practicing safe sex. What happens during an annual well check? The services and screenings done by your health care provider during your annual well check will depend on your age, overall health, lifestyle risk factors, and family history of disease. Counseling Your health care provider may ask you questions about your:  Alcohol use.  Tobacco use.  Drug use.  Emotional well-being.  Home and relationship well-being.  Sexual activity.  Eating habits.  Work and work Statistician. Screening You may have the following tests or measurements:  Height, weight, and BMI.  Blood pressure.  Lipid and cholesterol levels. These may be checked every 5 years, or more frequently if you are over 56 years old.  Skin check.  Lung cancer screening. You may have this screening every year starting at age 40  if you have a 30-pack-year history of smoking and currently smoke or have quit within the past 15 years.  Colorectal cancer screening. All adults should have this screening starting at age 45 and continuing until age 38. Your health care provider may  recommend screening at age 74. You will have tests every 1-10 years, depending on your results and the type of screening test. People at increased risk should start screening at an earlier age. Screening tests may include: ? Guaiac-based fecal occult blood testing. ? Fecal immunochemical test (FIT). ? Stool DNA test. ? Virtual colonoscopy. ? Sigmoidoscopy. During this test, a flexible tube with a tiny camera (sigmoidoscope) is used to examine your rectum and lower colon. The sigmoidoscope is inserted through your anus into your rectum and lower colon. ? Colonoscopy. During this test, a long, thin, flexible tube with a tiny camera (colonoscope) is used to examine your entire colon and rectum.  Prostate cancer screening. Recommendations will vary depending on your family history and other risks.  Hepatitis C blood test.  Hepatitis B blood test.  Sexually transmitted disease (STD) testing.  Diabetes screening. This is done by checking your blood sugar (glucose) after you have not eaten for a while (fasting). You may have this done every 1-3 years. Discuss your test results, treatment options, and if necessary, the need for more tests with your health care provider. Vaccines Your health care provider may recommend certain vaccines, such as:  Influenza vaccine. This is recommended every year.  Tetanus, diphtheria, and acellular pertussis (Tdap, Td) vaccine. You may need a Td booster every 10 years.  Varicella vaccine. You may need this if you have not been vaccinated.  Zoster vaccine. You may need this after age 76.  Measles, mumps, and rubella (MMR) vaccine. You may need at least one dose of MMR if you were born in 1957 or later. You may also need a second dose.  Pneumococcal 13-valent conjugate (PCV13) vaccine. You may need this if you have certain conditions and have not been vaccinated.  Pneumococcal polysaccharide (PPSV23) vaccine. You may need one or two doses if you smoke  cigarettes or if you have certain conditions.  Meningococcal vaccine. You may need this if you have certain conditions.  Hepatitis A vaccine. You may need this if you have certain conditions or if you travel or work in places where you may be exposed to hepatitis A.  Hepatitis B vaccine. You may need this if you have certain conditions or if you travel or work in places where you may be exposed to hepatitis B.  Haemophilus influenzae type b (Hib) vaccine. You may need this if you have certain risk factors. Talk to your health care provider about which screenings and vaccines you need and how often you need them. This information is not intended to replace advice given to you by your health care provider. Make sure you discuss any questions you have with your health care provider. Document Released: 05/15/2015 Document Revised: 06/08/2017 Document Reviewed: 02/17/2015 Elsevier Interactive Patient Education  2019 [employer].      No follow-ups on file.   Lucretia Kern, DO

## 2018-05-31 ENCOUNTER — Ambulatory Visit (INDEPENDENT_AMBULATORY_CARE_PROVIDER_SITE_OTHER): Payer: Managed Care, Other (non HMO) | Admitting: Family Medicine

## 2018-05-31 ENCOUNTER — Encounter: Payer: Self-pay | Admitting: Family Medicine

## 2018-05-31 VITALS — BP 120/90 | HR 80 | Temp 98.3°F | Ht 70.0 in | Wt 233.9 lb

## 2018-05-31 DIAGNOSIS — E785 Hyperlipidemia, unspecified: Secondary | ICD-10-CM | POA: Diagnosis not present

## 2018-05-31 DIAGNOSIS — E669 Obesity, unspecified: Secondary | ICD-10-CM

## 2018-05-31 DIAGNOSIS — Z23 Encounter for immunization: Secondary | ICD-10-CM

## 2018-05-31 DIAGNOSIS — I10 Essential (primary) hypertension: Secondary | ICD-10-CM

## 2018-05-31 DIAGNOSIS — R0683 Snoring: Secondary | ICD-10-CM

## 2018-05-31 DIAGNOSIS — J069 Acute upper respiratory infection, unspecified: Secondary | ICD-10-CM

## 2018-05-31 DIAGNOSIS — Z0000 Encounter for general adult medical examination without abnormal findings: Secondary | ICD-10-CM | POA: Diagnosis not present

## 2018-05-31 DIAGNOSIS — R739 Hyperglycemia, unspecified: Secondary | ICD-10-CM | POA: Diagnosis not present

## 2018-05-31 LAB — BASIC METABOLIC PANEL
BUN: 18 mg/dL (ref 6–23)
CALCIUM: 9.6 mg/dL (ref 8.4–10.5)
CHLORIDE: 102 meq/L (ref 96–112)
CO2: 29 meq/L (ref 19–32)
Creatinine, Ser: 0.96 mg/dL (ref 0.40–1.50)
GFR: 82.59 mL/min (ref >60.00)
GLUCOSE: 100 mg/dL — AB (ref 70–99)
Potassium: 4.5 mEq/L (ref 3.5–5.1)
SODIUM: 137 meq/L (ref 135–145)

## 2018-05-31 LAB — LIPID PANEL
CHOL/HDL RATIO: 6
Cholesterol: 206 mg/dL — ABNORMAL HIGH (ref 0–200)
HDL: 36.7 mg/dL — ABNORMAL LOW (ref >39.00)
LDL CALC: 134 mg/dL — AB (ref 0–99)
NONHDL: 169.27
Triglycerides: 175 mg/dL — ABNORMAL HIGH (ref 0.0–149.0)
VLDL: 35 mg/dL (ref 0.0–40.0)

## 2018-05-31 LAB — CBC
HEMATOCRIT: 45.9 % (ref 39.0–52.0)
HEMOGLOBIN: 15.5 g/dL (ref 13.0–17.0)
MCHC: 33.7 g/dL (ref 30.0–36.0)
MCV: 89.7 fl (ref 78.0–100.0)
Platelets: 236 10*3/uL (ref 150.0–400.0)
RBC: 5.12 Mil/uL (ref 4.22–5.81)
RDW: 14 % (ref 11.5–15.5)
WBC: 5.9 10*3/uL (ref 4.0–10.5)

## 2018-05-31 LAB — HEMOGLOBIN A1C: Hgb A1c MFr Bld: 5.5 % (ref 4.6–6.5)

## 2018-05-31 NOTE — Patient Instructions (Signed)
BEFORE YOU LEAVE: -flu shot -labs -follow up:  1 month for blood pressure, snoring, weight  START the NORVASC (Amlodipine) for blood pressure and take 1 tablet in the morning (71m) once daily.  Eat a healthy low sugar diet and get regular exercise. Goal for 10-20lbs of weight reduction over the next few months. Follow up promptly if you continue to have the concerns for sleep apnea or you chang eyour mind and would like a referral for evaluation for this.  We have ordered labs or studies at this visit. It can take up to 1-2 weeks for results and processing. IF results require follow up or explanation, we will call you with instructions. Clinically stable results will be [facility] If you have not heard from [place] cannot find your [facility] please contact our office at [phone]  If you are not yet signed [facility] for [facility] please consider signing up.    INSTRUCTIONS FOR UPPER RESPIRATORY INFECTION:  -plenty of rest and fluids  -nasal saline wash 2-3 times daily (use prepackaged nasal saline or bottled/distilled water if making your own)   -can use AFRIN nasal spray for drainage and nasal congestion - but do NOT use longer then 3-4 days  -can use tylenol (in no history of liver disease) as directed for aches and sorethroat  -in the winter time, using a humidifier at night is helpful (please follow cleaning instructions)  -if you are taking a cough medication - use only as directed, may also try a teaspoon of honey to coat the throat and throat lozenges.   -for sore throat, salt water gargles can help  -follow up if you have fevers, facial pain, tooth pain, difficulty breathing or are worsening or symptoms persist longer then expected  Upper Respiratory Infection, Adult An upper respiratory infection (URI) is also known as the common cold. It is often caused by a type of germ (virus). Colds are easily spread (contagious). You can pass it to others by  kissing, coughing, sneezing, or drinking out of the same glass. Usually, you get better in 1 to 3  weeks.  However, the cough can last for even longer. HOME CARE   Only take medicine as told by your doctor. Follow instructions provided above.  Drink enough water and fluids to keep your pee (urine) clear or pale yellow.  Get plenty of rest.  Return to work when your temperature is < 100 for 24 hours or as told by your doctor. You may use a face mask and wash your hands to stop your cold from spreading. GET HELP RIGHT AWAY IF:   After the first few days, you feel you are getting worse.  You have questions about your medicine.  You have chills, shortness of breath, or red spit (mucus).  You have pain in the face for more then 1-2 days, especially when you bend forward.  You have a fever, puffy (swollen) neck, pain when you swallow, or white spots in the back of your throat.  You have a bad headache, ear pain, sinus pain, or chest pain.  You have a high-pitched whistling sound when you breathe in and out (wheezing).  You cough up blood.  You have sore muscles or a stiff neck. MAKE SURE YOU:   Understand these instructions.  Will watch your condition.  Will get help right away if you are not doing well or get worse. Document Released: 10/05/2007 Document Revised: 07/11/2011 Document Reviewed: 07/24/2013 ExitCare  Patient Information 2015 [place]. This information is not intended to replace advice given to you by your health care provider. Make sure you discuss any questions you have with your health care provider.    Preventive Care 40-64 Years, Male Preventive care refers to lifestyle choices and visits with your health care provider that can promote health and wellness. What does preventive care include?   A yearly physical exam. This is also called an annual well check.  Dental exams once or twice a year.  Routine eye exams. Ask your health care provider how  often you should have your eyes checked.  Personal lifestyle choices, including: ? Daily care of your teeth and gums. ? Regular physical activity. ? Eating a healthy diet. ? Avoiding tobacco and drug use. ? Limiting alcohol use. ? Practicing safe sex. What happens during an annual well check? The services and screenings done by your health care provider during your annual well check will depend on your age, overall health, lifestyle risk factors, and family history of disease. Counseling Your health care provider may ask you questions about your:  Alcohol use.  Tobacco use.  Drug use.  Emotional well-being.  Home and relationship well-being.  Sexual activity.  Eating habits.  Work and work Statistician. Screening You may have the following tests or measurements:  Height, weight, and BMI.  Blood pressure.  Lipid and cholesterol levels. These may be checked every 5 years, or more frequently if you are over 20 years old.  Skin check.  Lung cancer screening. You may have this screening every year starting at age 71 if you have a 30-pack-year history of smoking and currently smoke or have quit within the past 15 years.  Colorectal cancer screening. All adults should have this screening starting at age 11 and continuing until age 43. Your health care provider may recommend screening at age 53. You will have tests every 1-10 years, depending on your results and the type of screening test. People at increased risk should start screening at an earlier age. Screening tests may include: ? Guaiac-based fecal occult blood testing. ? Fecal immunochemical test (FIT). ? Stool DNA test. ? Virtual colonoscopy. ? Sigmoidoscopy. During this test, a flexible tube with a tiny camera (sigmoidoscope) is used to examine your rectum and lower colon. The sigmoidoscope is inserted through your anus into your rectum and lower colon. ? Colonoscopy. During this test, a long, thin, flexible tube with  a tiny camera (colonoscope) is used to examine your entire colon and rectum.  Prostate cancer screening. Recommendations will vary depending on your family history and other risks.  Hepatitis C blood test.  Hepatitis B blood test.  Sexually transmitted disease (STD) testing.  Diabetes screening. This is done by checking your blood sugar (glucose) after you have not eaten for a while (fasting). You may have this done every 1-3 years. Discuss your test results, treatment options, and if necessary, the need for more tests with your health care provider. Vaccines Your health care provider may recommend certain vaccines, such as:  Influenza vaccine. This is recommended every year.  Tetanus, diphtheria, and acellular pertussis (Tdap, Td) vaccine. You may need a Td booster every 10 years.  Varicella vaccine. You may need this if you have not been vaccinated.  Zoster vaccine. You may need this after age 28.  Measles, mumps, and rubella (MMR) vaccine. You may need at least one dose of MMR if you were born in 1957 or later. You may also need  a second dose.  Pneumococcal 13-valent conjugate (PCV13) vaccine. You may need this if you have certain conditions and have not been vaccinated.  Pneumococcal polysaccharide (PPSV23) vaccine. You may need one or two doses if you smoke cigarettes or if you have certain conditions.  Meningococcal vaccine. You may need this if you have certain conditions.  Hepatitis A vaccine. You may need this if you have certain conditions or if you travel or work in places where you may be exposed to hepatitis A.  Hepatitis B vaccine. You may need this if you have certain conditions or if you travel or work in places where you may be exposed to hepatitis B.  Haemophilus influenzae type b (Hib) vaccine. You may need this if you have certain risk factors. Talk to your health care provider about which screenings and vaccines you need and how often you need them. This  information is not intended to replace advice given to you by your health care provider. Make sure you discuss any questions you have with your health care provider. Document Released: 05/15/2015 Document Revised: 06/08/2017 Document Reviewed: 02/17/2015 Elsevier Interactive Patient Education  2019 [employer].

## 2018-06-01 LAB — RPR: RPR: NONREACTIVE

## 2018-06-01 LAB — HIV ANTIBODY (ROUTINE TESTING W REFLEX): HIV 1&2 Ab, 4th Generation: NONREACTIVE

## 2018-06-11 ENCOUNTER — Other Ambulatory Visit: Payer: Self-pay | Admitting: Family Medicine

## 2018-06-11 MED ORDER — AMLODIPINE BESYLATE 5 MG PO TABS: 5.0000 mg | ORAL_TABLET | Freq: Every day | ORAL | 1 refills | Status: DC

## 2018-06-11 NOTE — Telephone Encounter (Signed)
Copied from CRM [id number]. Topic: General - Inquiry >> Jun 11, 2018 12:53 PM Windy Kalata, NT wrote: Reason for CRM: patient is calling and states at his visit on 05/31/18 he was told Dr. Selena Batten was sending in a blood pressure medication but his pharmacy has not received anything, please advise.

## 2018-06-11 NOTE — Telephone Encounter (Signed)
I called the pt and informed him the Rx was sent to his pharmacy (Rx done per office notes).

## 2018-06-29 NOTE — Progress Notes (Signed)
HPI:  Using dictation device. Unfortunately this device frequently misinterprets words/phrases.  Timothy Jennings is a pleasant 53 y.o. here for follow up. Chronic medical problems summarized below were reviewed for changes and stability and were updated as needed below. These issues and their treatment remain stable for the most part. Reports doing well. Exercising at the gym 2x per week. Trying to eat healthier. Denies CP, SOB, DOE, treatment intolerance or new symptoms.  HLD/Obesity/hyperglycemia: -meds: pravastatin -wt 238 (1/19) --> 222 (7/19) --> 233 (1/20) -->237 (3/20)  Allergic Rhinitis: -saw allergist in the past -takes claritin and benadryl  ED: -meds: viagra prn -requests refill  ROS: See pertinent positives and negatives per HPI.  Past Medical History:  Diagnosis Date  . Allergy   . Chicken pox   . Hyperlipidemia 02/21/2012  . 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 Otitis media    followed by ENT   . Trigger finger, acquired 07/01/2015   Involves 5th MCP on RT with nodule noted on [place]  Thumb may rarely trigger     Past Surgical History:  Procedure Laterality Date  . BILATERAL CARPAL TUNNEL RELEASE    . EYE SURGERY    . HEMORROIDECTOMY    . NECK SURGERY  2007  . ORIF TIBIA PLATEAU Right 12/08/2017   Procedure: OPEN REDUCTION INTERNAL FIXATION (ORIF) LATERAL TIBIAL PLATEAU;  Surgeon: Yolonda Kida, MD;  Location: [facility] OR;  Service: Orthopedics;  Laterality: Right;  . SPINE SURGERY    . TYMPANOSTOMY TUBE PLACEMENT    . VASECTOMY      Family History  Problem Relation Age of Onset  . Colon cancer Mother 57       dx at 56  . Hyperlipidemia Father   . Hypertension Paternal Grandmother   . Heart disease Paternal Grandmother   . Hypertension Paternal Grandfather   . Heart disease Paternal Grandfather   . Alcohol abuse Unknown        Grandparent  . Colon polyps Neg Hx   . Esophageal cancer Neg Hx   .  Rectal cancer Neg Hx   . Stomach cancer Neg Hx     SOCIAL HX: see hpi   Current Outpatient Medications:  .  amLODipine (NORVASC) 5 MG tablet, Take 1 tablet (5 mg total) by mouth daily., Disp: 30 tablet, Rfl: 1 .  diphenhydrAMINE (BENADRYL ALLERGY) 25 mg capsule, Take 25 mg by mouth daily as needed for allergies. , Disp: , Rfl:  .  ibuprofen (ADVIL,MOTRIN) 600 MG tablet, Take 1 tablet (600 mg total) by mouth every 6 (six) hours as needed for moderate pain., Disp: 60 tablet, Rfl: 0 .  loratadine (CLARITIN) 10 MG tablet, Take 10 mg by mouth daily., Disp: , Rfl:  .  naproxen sodium (ALEVE) 220 MG tablet, Take 660 mg by mouth daily as needed (for pain or headache)., Disp: , Rfl:  .  pravastatin (PRAVACHOL) 40 MG tablet, TAKE 1 TABLET BY MOUTH DAILY, Disp: 90 tablet, Rfl: 2 .  sildenafil (VIAGRA) 25 MG tablet, TAKE 1 TO 2 TABLETS BY MOUTH AS NEEDED FOR SEXUAL ACTIVITY. (Patient taking differently: Take 25-50 mg by mouth as needed for erectile dysfunction. ), Disp: 10 tablet, Rfl: 3  EXAM:  Vitals:   07/02/18 0759  BP: 120/82  Pulse: 85  Temp: 98.2 F (36.8 C)    Body mass index is 34.03 kg/m.  GENERAL: vitals reviewed and listed above, alert, oriented, appears well hydrated  and in no acute distress  HEENT: atraumatic, conjunttiva clear, no obvious abnormalities on inspection of external nose and ears  NECK: no obvious masses on inspection  LUNGS: clear to auscultation bilaterally, no wheezes, rales or rhonchi, good air movement  CV: HRRR, no peripheral edema  MS: moves all extremities without noticeable abnormality  PSYCH: pleasant and cooperative, no obvious depression or anxiety  ASSESSMENT AND PLAN:  Discussed the following assessment and plan:  Elevated blood pressure reading  lifestyle recs -goal to reduce wt to < 200 -bp borderline but opted to cont current treatment with meds + increased lifestyle changes -Patient advised to return or notify a doctor immediately  if symptoms worsen or persist or new concerns arise.  Patient Instructions  BEFORE YOU LEAVE: -follow up: 3 months  Try to get weight under 200   We recommend the following healthy lifestyle for LIFE: 1) Small portions. But, make sure to get regular (at least 3 per day), healthy meals and small healthy snacks if needed.  2) Eat a healthy clean diet.   TRY TO EAT: -at least 5-7 servings of low sugar, colorful, and nutrient rich vegetables per day (not corn, potatoes or bananas.) -berries are the best choice if you wish to eat fruit (only eat small amounts if trying to reduce weight)  -lean meets (fish, white meat of chicken or [place]) -vegan proteins for some meals - beans or tofu, whole grains, nuts and seeds -Replace bad fats with good fats - good fats include: fish, nuts and seeds, canola oil, olive oil -small amounts of low fat or non fat dairy -small amounts of100 % whole grains - check the lables -drink plenty of water  AVOID: -SUGAR, sweets, anything with added sugar, corn syrup or sweeteners - must read labels as even foods advertised as "healthy" often are loaded with sugar -if you must have a sweetener, small amounts of stevia may be best -sweetened beverages and artificially sweetened beverages -simple starches (rice, bread, potatoes, pasta, chips, etc - small amounts of 100% whole grains are ok) -red meat, pork, butter -fried foods, fast food, processed food, excessive dairy, eggs and coconut.  3)Get at least 150 minutes of sweaty aerobic exercise per week.  4)Reduce stress - consider counseling, meditation and relaxation to balance other aspects of your life.     Terressa Koyanagi, DO

## 2018-07-02 ENCOUNTER — Encounter: Payer: Self-pay | Admitting: Family Medicine

## 2018-07-02 ENCOUNTER — Ambulatory Visit: Payer: Managed Care, Other (non HMO) | Admitting: Family Medicine

## 2018-07-02 VITALS — BP 120/82 | HR 85 | Temp 98.2°F | Ht 70.0 in | Wt 237.2 lb

## 2018-07-02 DIAGNOSIS — Z6832 Body mass index (BMI) 32.0-32.9, adult: Secondary | ICD-10-CM | POA: Diagnosis not present

## 2018-07-02 DIAGNOSIS — R739 Hyperglycemia, unspecified: Secondary | ICD-10-CM

## 2018-07-02 DIAGNOSIS — E785 Hyperlipidemia, unspecified: Secondary | ICD-10-CM | POA: Diagnosis not present

## 2018-07-02 DIAGNOSIS — R030 Elevated blood-pressure reading, without diagnosis of hypertension: Secondary | ICD-10-CM | POA: Diagnosis not present

## 2018-07-02 NOTE — Patient Instructions (Signed)
BEFORE YOU LEAVE: -follow up: 3 months  Try to get weight under 200   We recommend the following healthy lifestyle for LIFE: 1) Small portions. But, make sure to get regular (at least 3 per day), healthy meals and small healthy snacks if needed.  2) Eat a healthy clean diet.   TRY TO EAT: -at least 5-7 servings of low sugar, colorful, and nutrient rich vegetables per day (not corn, potatoes or bananas.) -berries are the best choice if you wish to eat fruit (only eat small amounts if trying to reduce weight)  -lean meets (fish, white meat of chicken or [place]) -vegan proteins for some meals - beans or tofu, whole grains, nuts and seeds -Replace bad fats with good fats - good fats include: fish, nuts and seeds, canola oil, olive oil -small amounts of low fat or non fat dairy -small amounts of100 % whole grains - check the lables -drink plenty of water  AVOID: -SUGAR, sweets, anything with added sugar, corn syrup or sweeteners - must read labels as even foods advertised as "healthy" often are loaded with sugar -if you must have a sweetener, small amounts of stevia may be best -sweetened beverages and artificially sweetened beverages -simple starches (rice, bread, potatoes, pasta, chips, etc - small amounts of 100% whole grains are ok) -red meat, pork, butter -fried foods, fast food, processed food, excessive dairy, eggs and coconut.  3)Get at least 150 minutes of sweaty aerobic exercise per week.  4)Reduce stress - consider counseling, meditation and relaxation to balance other aspects of your life.

## 2018-07-10 ENCOUNTER — Encounter: Payer: Self-pay | Admitting: Family Medicine

## 2018-07-10 ENCOUNTER — Ambulatory Visit: Payer: Managed Care, Other (non HMO) | Admitting: Family Medicine

## 2018-07-10 VITALS — BP 158/92 | HR 82 | Temp 97.8°F | Wt 237.4 lb

## 2018-07-10 DIAGNOSIS — J019 Acute sinusitis, unspecified: Secondary | ICD-10-CM | POA: Diagnosis not present

## 2018-07-10 MED ORDER — AMOXICILLIN-POT CLAVULANATE 875-125 MG PO TABS: 1.0000 | ORAL_TABLET | Freq: Two times a day (BID) | ORAL | 0 refills | Status: DC

## 2018-07-10 MED ORDER — METHYLPREDNISOLONE ACETATE 80 MG/ML IJ SUSP
120.0000 mg | Freq: Once | INTRAMUSCULAR | Status: AC
  Administered 2018-07-10: 120 mg via INTRAMUSCULAR

## 2018-07-10 NOTE — Addendum Note (Signed)
Addended by: Marcellus Scott on: 07/10/2018 04:02 PM   Modules accepted: Orders

## 2018-07-10 NOTE — Progress Notes (Signed)
   Subjective:    Patient ID: Timothy Jennings, male    DOB: 11-12-1965, 53 y.o.   MRN: [id number]  HPI Here for 2 days of sinus pressure, facial pain, PND, and coughing up yellow sputum. No fever.   Review of Systems  Constitutional: Negative.   HENT: Positive for congestion, ear pain, facial swelling, postnasal drip, sinus pressure and sinus pain. Negative for sore throat.   Eyes: Negative.   Respiratory: Positive for cough.        Objective:   Physical Exam Constitutional:      Appearance: Normal appearance.  HENT:     Right Ear: Tympanic membrane and ear canal normal.     Left Ear: Tympanic membrane and ear canal normal.     Nose: Nose normal.     Mouth/Throat:     Pharynx: Oropharynx is clear.  Eyes:     Conjunctiva/sclera: Conjunctivae normal.  Pulmonary:     Effort: Pulmonary effort is normal. No respiratory distress.     Breath sounds: Normal breath sounds. No stridor. No wheezing, rhonchi or rales.  Lymphadenopathy:     Cervical: No cervical adenopathy.  Neurological:     Mental Status: He is alert.           Assessment & Plan:  Sinusitis, treat with Augmentin and a steroid shot. Add Mucinex D prn.  Gershon Crane, MD

## 2018-07-16 ENCOUNTER — Ambulatory Visit: Payer: Managed Care, Other (non HMO) | Admitting: Family Medicine

## 2018-07-26 ENCOUNTER — Ambulatory Visit (INDEPENDENT_AMBULATORY_CARE_PROVIDER_SITE_OTHER): Payer: Managed Care, Other (non HMO) | Admitting: Family Medicine

## 2018-07-26 ENCOUNTER — Other Ambulatory Visit: Payer: Self-pay

## 2018-07-26 DIAGNOSIS — F320 Major depressive disorder, single episode, mild: Secondary | ICD-10-CM

## 2018-07-26 DIAGNOSIS — F439 Reaction to severe stress, unspecified: Secondary | ICD-10-CM | POA: Diagnosis not present

## 2018-07-26 NOTE — Progress Notes (Signed)
Virtual Visit via Video Note  I connected with Timothy Jennings on 07/26/18 at 10:30 AM EDT by a video enabled telemedicine application and verified that I am speaking with the correct person using two identifiers.  Location patient: home Location provider:work or home office Persons participating in the virtual visit: patient, provider  I discussed the limitations of evaluation and management by telemedicine and the availability of in person appointments. The patient expressed understanding and agreed to proceed.   HPI:  Irritability/? 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  Past Medical History:  Diagnosis Date  . Allergy   . Chicken pox   . Hyperlipidemia 02/21/2012  . 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 Otitis media    followed by ENT   . Trigger finger, acquired 07/01/2015   Involves 5th MCP on RT with nodule noted on [place]  Thumb may rarely trigger     Past Surgical History:  Procedure Laterality Date  . BILATERAL CARPAL TUNNEL RELEASE    . EYE SURGERY    . HEMORROIDECTOMY    . NECK SURGERY  2007  . ORIF TIBIA PLATEAU Right 12/08/2017   Procedure: OPEN REDUCTION INTERNAL FIXATION (ORIF) LATERAL TIBIAL PLATEAU;  Surgeon: Yolonda Kida, MD;  Location: [facility] OR;  Service: Orthopedics;  Laterality: Right;  . SPINE SURGERY    . TYMPANOSTOMY TUBE PLACEMENT    . VASECTOMY      Family History  Problem Relation Age of Onset  . Colon cancer Mother 23       dx at 26  . Hyperlipidemia Father   .  Hypertension Paternal Grandmother   . Heart disease Paternal Grandmother   . Hypertension Paternal Grandfather   . Heart disease Paternal Grandfather   . Alcohol abuse Unknown        Grandparent  . Colon polyps Neg Hx   . Esophageal cancer Neg Hx   . Rectal cancer Neg Hx   . Stomach cancer Neg Hx     SOCIAL HX: see hpi   Current Outpatient Medications:  .  amLODipine (NORVASC) 5 MG tablet, Take 1 tablet (5 mg total) by mouth daily., Disp: 30 tablet, Rfl: 1 .  diphenhydrAMINE (BENADRYL ALLERGY) 25 mg capsule, Take 25 mg by mouth daily as needed for allergies. , Disp: , Rfl:  .  loratadine (CLARITIN) 10 MG tablet, Take 10 mg by mouth daily., Disp: , Rfl:  .  naproxen sodium (ALEVE) 220 MG tablet, Take 660 mg by mouth daily as needed (for pain or headache)., Disp: , Rfl:  .  pravastatin (PRAVACHOL) 40 MG tablet, TAKE 1 TABLET BY MOUTH DAILY, Disp: 90 tablet, Rfl: 2 .  sildenafil (VIAGRA) 25 MG tablet, TAKE 1 TO 2 TABLETS BY MOUTH AS NEEDED FOR SEXUAL ACTIVITY. (Patient taking differently: Take 25-50 mg by mouth as needed for erectile dysfunction. ), Disp: 10 tablet, Rfl: 3  EXAM:  VITALS per patient if applicable:  GENERAL: alert, oriented, appears well and in no acute distress  HEENT: atraumatic,  conjunttiva clear, no obvious abnormalities on inspection of external nose and ears  NECK: normal movements of the head and neck  LUNGS: on inspection no signs of respiratory distress, breathing rate appears normal, no obvious gross SOB, gasping or wheezing  CV: no obvious cyanosis  MS: moves all visible extremities without noticeable abnormality  PSYCH/NEURO: pleasant and cooperative, no obvious depression or anxiety, speech and thought processing grossly intact  ASSESSMENT AND PLAN:  Discussed the following assessment and plan:  Depression, major, single episode, mild (HCC)  Stress  -we discussed possible serious and likely etiologies, workup and treatment, treatment risks  and return precautions; suspect stress and mild depression -after this discussion, Timothy Jennings opted for starting with CBT - he has the info to contact [facility] [facility] online to schedule a virtual NPV. Also discussed medications - opted to hold off for now.  I discussed the assessment and treatment plan with the patient. The patient was provided an opportunity to ask questions and all were answered. The patient agreed with the plan and demonstrated an understanding of the instructions. Verbal contract for safety. Agrees to see care/emergency care if worsening or severe symptoms.   The patient was advised to call back or seek an in-person evaluation if the symptoms worsen or if the condition fails to improve as anticipated.  Follow up instructions: Instructions for assistant, Ronnald Collum: -schedule virtual follow up with Dr. Selena Batten in 3-4 weeks -ensure patient has contact # for [facility] to set up counseling  I provided 28  minutes of non-face-to-face time during this encounter.   Terressa Koyanagi, DO

## 2018-08-11 ENCOUNTER — Other Ambulatory Visit: Payer: Self-pay | Admitting: Family Medicine

## 2018-08-14 ENCOUNTER — Ambulatory Visit (INDEPENDENT_AMBULATORY_CARE_PROVIDER_SITE_OTHER): Payer: 59 | Admitting: Psychology

## 2018-08-14 DIAGNOSIS — F320 Major depressive disorder, single episode, mild: Secondary | ICD-10-CM

## 2018-08-16 ENCOUNTER — Ambulatory Visit (INDEPENDENT_AMBULATORY_CARE_PROVIDER_SITE_OTHER): Payer: Self-pay | Admitting: Family Medicine

## 2018-08-16 ENCOUNTER — Other Ambulatory Visit: Payer: Self-pay

## 2018-08-16 DIAGNOSIS — F320 Major depressive disorder, single episode, mild: Secondary | ICD-10-CM

## 2018-08-16 DIAGNOSIS — Z72820 Sleep deprivation: Secondary | ICD-10-CM

## 2018-08-16 DIAGNOSIS — F419 Anxiety disorder, unspecified: Secondary | ICD-10-CM

## 2018-08-16 MED ORDER — SERTRALINE HCL 50 MG PO TABS: 50.0000 mg | ORAL_TABLET | Freq: Every day | ORAL | 0 refills | Status: DC

## 2018-08-16 NOTE — Progress Notes (Signed)
Virtual Visit via Video Note  I connected with   on 08/16/18 at  8:00 AM EDT by a video enabled telemedicine application and verified that I am speaking with the correct person using two identifiers.  Location patient: home Location provider:work or home office Persons participating in the virtual visit: patient, provider  I discussed the limitations of evaluation and management by telemedicine and the availability of in person appointments. The patient expressed understanding and agreed to proceed.   HPI:  Irritability/? 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  Past Medical History:  Diagnosis Date  . Allergy   . Chicken pox   . Hyperlipidemia 02/21/2012  . 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 Otitis media    followed by ENT   . Trigger finger, acquired 07/01/2015   Involves 5th MCP on RT with nodule noted on [place]  Thumb may rarely trigger     Past Surgical History:  Procedure Laterality Date  . BILATERAL CARPAL TUNNEL RELEASE    . EYE SURGERY    . HEMORROIDECTOMY    . NECK SURGERY  2007  . ORIF TIBIA PLATEAU Right 12/08/2017   Procedure: OPEN REDUCTION INTERNAL FIXATION (ORIF) LATERAL TIBIAL PLATEAU;  Surgeon: Yolonda Kida, MD;   Location: [facility] OR;  Service: Orthopedics;  Laterality: Right;  . SPINE SURGERY    . TYMPANOSTOMY TUBE PLACEMENT    . VASECTOMY      Family History  Problem Relation Age of Onset  . Colon cancer Mother 19       dx at 26  . Hyperlipidemia Father   . Hypertension Paternal Grandmother   . Heart disease Paternal Grandmother   . Hypertension Paternal Grandfather   . Heart disease Paternal Grandfather   . Alcohol abuse Unknown        Grandparent  . Colon polyps Neg Hx   . Esophageal cancer Neg Hx   . Rectal cancer Neg Hx   . Stomach cancer Neg Hx     SOCIAL HX: see hpi  Current Outpatient Medications:  .  amLODipine (NORVASC) 5 MG tablet, TAKE 1 TABLET(5 MG) BY MOUTH DAILY, Disp: 30 tablet, Rfl: 5 .  diphenhydrAMINE (BENADRYL ALLERGY) 25 mg capsule, Take 25 mg by mouth daily as needed for allergies. , Disp: , Rfl:  .  loratadine (CLARITIN) 10 MG tablet, Take 10 mg by mouth daily., Disp: , Rfl:  .  pravastatin (PRAVACHOL) 40 MG tablet, TAKE 1 TABLET BY MOUTH DAILY, Disp: 90 tablet, Rfl: 2 .  sertraline (ZOLOFT) 50 MG tablet, Take 1 tablet (50 mg total) by mouth daily., Disp: 90 tablet, Rfl: 0 .  sildenafil (VIAGRA) 25 MG tablet, TAKE 1 TO 2 TABLETS BY MOUTH AS NEEDED FOR  SEXUAL ACTIVITY. (Patient taking differently: Take 25-50 mg by mouth as needed for erectile dysfunction. ), Disp: 10 tablet, Rfl: 3  EXAM:  VITALS per patient if applicable:none  GENERAL: alert, oriented, appears well and in no acute distress  HEENT: atraumatic, conjunttiva clear, no obvious abnormalities on inspection of external nose and ears  NECK: normal movements of the head and neck  LUNGS: on inspection no signs of respiratory distress, breathing rate appears normal, no obvious gross SOB, gasping or wheezing  CV: no obvious cyanosis  MS: moves all visible extremities without noticeable abnormality  PSYCH/NEURO: pleasant and cooperative, flat affect, speech and thought processing grossly  intact  ASSESSMENT AND PLAN:  Discussed the following assessment and plan:  Depression, major, single episode, mild (HCC)  Anxiety  Poor sleep  Discussed options for management, risks, implications and possible complications. He opted to continue CBT and try an SSRI. After discussion various options decided to try zoloft. Rx sent. Interactions and risks discussed. Follow up 3 weeks.   I discussed the assessment and treatment plan with the patient. The patient was provided an opportunity to ask questions and all were answered. The patient agreed with the plan and demonstrated an understanding of the instructions.   The patient was advised to call back or seek an in-person evaluation if the symptoms worsen or if the condition fails to improve as anticipated.   Follow up instructions: Advised assistant Ronnald CollumJo Anne to help patient arrange the following: -follow up with Dr. Selena BattenKim in 3 weeks on a Tues or Thursday -TOC with Dr. Hassan RowanKoberlein in 3 months   Terressa KoyanagiHannah R Denelda Akerley, DO

## 2018-08-28 ENCOUNTER — Ambulatory Visit (INDEPENDENT_AMBULATORY_CARE_PROVIDER_SITE_OTHER): Payer: 59 | Admitting: Psychology

## 2018-08-28 DIAGNOSIS — F320 Major depressive disorder, single episode, mild: Secondary | ICD-10-CM | POA: Diagnosis not present

## 2018-09-06 ENCOUNTER — Ambulatory Visit (INDEPENDENT_AMBULATORY_CARE_PROVIDER_SITE_OTHER): Payer: Self-pay | Admitting: Family Medicine

## 2018-09-06 ENCOUNTER — Other Ambulatory Visit: Payer: Self-pay

## 2018-09-06 DIAGNOSIS — F325 Major depressive disorder, single episode, in full remission: Secondary | ICD-10-CM

## 2018-09-06 DIAGNOSIS — Q159 Congenital malformation of eye, unspecified: Secondary | ICD-10-CM

## 2018-09-06 NOTE — Progress Notes (Signed)
Virtual Visit via Video Note  I connected with Timothy Jennings  on 09/06/18 at 10:00 AM EDT by a video enabled telemedicine application and verified that I am speaking with the correct person using two identifiers.  Location patient: home Location provider:work or home office Persons participating in the virtual visit: patient, provider  I discussed the limitations of evaluation and management by telemedicine and the availability of in person appointments. The patient expressed understanding and agreed to proceed.   HPI:  Follow up Depression: -started in the 2020 -seeing Timothy Jennings for CBT -started zoloft 08/16/18 -reports is doing well and wife feels irritability much reduced, denies depression thoughts of harm -his grandfather passed away from the coronovirus a few weeks ago, he died isolated and alone and this has been hard on his grandmother - despite this being tough he feels overall mood is better  Bleeding from tear duct: -noticed small amount yesterday -L eye, small amount dried blood this morning -no swelling, redness, pain, fevers, vision change, trauma, purulent discharge -has eye doctor and plans to contact them today   ROS: See pertinent positives and negatives per HPI.  Past Medical History:  Diagnosis Date  . Allergy   . Chicken pox   . Hyperlipidemia 02/21/2012  . 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 Otitis media    followed by ENT   . Trigger finger, acquired 07/01/2015   Involves 5th MCP on RT with nodule noted on [place]  Thumb may rarely trigger     Past Surgical History:  Procedure Laterality Date  . BILATERAL CARPAL TUNNEL RELEASE    . EYE SURGERY    . HEMORROIDECTOMY    . NECK SURGERY  2007  . ORIF TIBIA PLATEAU Right 12/08/2017   Procedure: OPEN REDUCTION INTERNAL FIXATION (ORIF) LATERAL TIBIAL PLATEAU;  Surgeon: Yolonda Kida, MD;  Location: [facility] OR;  Service: Orthopedics;  Laterality: Right;  . SPINE  SURGERY    . TYMPANOSTOMY TUBE PLACEMENT    . VASECTOMY      Family History  Problem Relation Age of Onset  . Colon cancer Mother 68       dx at 3  . Hyperlipidemia Father   . Hypertension Paternal Grandmother   . Heart disease Paternal Grandmother   . Hypertension Paternal Grandfather   . Heart disease Paternal Grandfather   . Alcohol abuse Unknown        Grandparent  . Colon polyps Neg Hx   . Esophageal cancer Neg Hx   . Rectal cancer Neg Hx   . Stomach cancer Neg Hx     SOCIAL HX: see hpi    Current Outpatient Medications:  .  amLODipine (NORVASC) 5 MG tablet, TAKE 1 TABLET(5 MG) BY MOUTH DAILY, Disp: 30 tablet, Rfl: 5 .  diphenhydrAMINE (BENADRYL ALLERGY) 25 mg capsule, Take 25 mg by mouth daily as needed for allergies. , Disp: , Rfl:  .  loratadine (CLARITIN) 10 MG tablet, Take 10 mg by mouth daily., Disp: , Rfl:  .  pravastatin (PRAVACHOL) 40 MG tablet, TAKE 1 TABLET BY MOUTH DAILY, Disp: 90 tablet, Rfl: 2 .  sertraline (ZOLOFT) 50 MG tablet, Take 1 tablet (50 mg total) by mouth daily., Disp: 90 tablet, Rfl: 0 .  sildenafil (VIAGRA) 25 MG tablet, TAKE 1 TO 2 TABLETS BY MOUTH AS NEEDED FOR SEXUAL ACTIVITY. (Patient taking differently: Take 25-50 mg by mouth as needed for erectile dysfunction. ), Disp: 10 tablet,  Rfl: 3  EXAM:  VITALS per patient if applicable:none  GENERAL: alert, oriented, appears well and in no acute distress  HEENT: atraumatic, conjunttiva clear, no obvious abnormalities on inspection of external nose and ears  NECK: normal movements of the head and neck  LUNGS: on inspection no signs of respiratory distress, breathing rate appears normal, no obvious gross SOB, gasping or wheezing  CV: no obvious cyanosis  MS: moves all visible extremities without noticeable abnormality  PSYCH/NEURO: pleasant and cooperative, no obvious depression or anxiety, speech and thought processing grossly intact  ASSESSMENT AND PLAN:  Discussed the following  assessment and plan:  Depression, major, single episode, complete remission (HCC) -glad doing well -continue CBT and SSRI -sees Timothy Jennings for meet and greet in July and then will follow with me  Eye anomaly -discussed potential etiologies benign and serious and advised optho evaluation -he agrees to call his opthomologist. Advised if any issues getting in with his eye doctor to call us so that we can help.    I discussed the assessment and treatment plan with the patient. The patient was provided an opportunity to ask questions and all were answered. The patient agreed with the plan and demonstrated an understanding of the instructions.   The patient was advised to call back or seek an in-person evaluation if the symptoms worsen or if the condition fails to improve as anticipated.   Follow up instructions: Advised assistant Timothy Jennings to help patient arrange the following: -follow up with Dr. Hassan RowanKoberlein in 2 months as planned, then with Dr. Laurey MoraleKim   Baelynn Schmuhl R Madline Oesterling, DO

## 2018-09-11 ENCOUNTER — Ambulatory Visit: Payer: 59 | Admitting: Psychology

## 2018-10-02 ENCOUNTER — Ambulatory Visit: Payer: Managed Care, Other (non HMO) | Admitting: Family Medicine

## 2018-11-05 ENCOUNTER — Other Ambulatory Visit: Payer: Self-pay | Admitting: Family Medicine

## 2018-11-26 ENCOUNTER — Other Ambulatory Visit: Payer: Self-pay

## 2018-11-26 ENCOUNTER — Ambulatory Visit (INDEPENDENT_AMBULATORY_CARE_PROVIDER_SITE_OTHER): Payer: Managed Care, Other (non HMO) | Admitting: Family Medicine

## 2018-11-26 ENCOUNTER — Encounter: Payer: Self-pay | Admitting: Family Medicine

## 2018-11-26 ENCOUNTER — Telehealth: Payer: Self-pay | Admitting: *Deleted

## 2018-11-26 DIAGNOSIS — E785 Hyperlipidemia, unspecified: Secondary | ICD-10-CM

## 2018-11-26 DIAGNOSIS — F321 Major depressive disorder, single episode, moderate: Secondary | ICD-10-CM

## 2018-11-26 DIAGNOSIS — R739 Hyperglycemia, unspecified: Secondary | ICD-10-CM | POA: Diagnosis not present

## 2018-11-26 NOTE — Progress Notes (Signed)
Virtual Visit via Video Note  I connected with Timothy Jennings   on 11/26/18 at  8:00 AM EDT by a video enabled telemedicine application and verified that I am speaking with the correct person using two identifiers.  Location patient: home Location provider:work office Persons participating in the virtual visit: patient, provider  I discussed the limitations of evaluation and management by telemedicine and the availability of in person appointments. The patient expressed understanding and agreed to proceed.   Timothy Jennings DOB: April 11, 1966 Encounter date: 11/26/2018  This is a 53 y.o. male who presents to establish care. No chief complaint on file.  Last visit was virtual with HK on 09/06/18. Follow up with depression but also some bleeding from eye. Last preventative was 05/31/18.  History of present illness: Hyperlipidemia: goes back and forth on dieting. Healthier meals and portion control. They enjoy food which is what makes it more difficult for them. Hasn't been walking/exercising.   Hyperglycemia:noted on previous bloodwork. Not currently being careful about eating well.  Depression: following with Timothy Jennings for CBT but has not followed with him recently. Zoloft started 08/16/18.  Mood has been doing ok. Feels a lot calmer. Sleeping ok.   Past Medical History:  Diagnosis Date  . Allergy   . Chicken pox   . Hyperlipidemia 02/21/2012  . Osteoarthritis of right thumb 07/01/2015   Noted with spurring and subluxation on [place]   . Otitis media    followed by ENT   . Trigger finger, acquired 07/01/2015   Involves 5th MCP on RT with nodule noted on US  Thumb may rarely trigger    Past Surgical History:  Procedure Laterality Date  . BILATERAL CARPAL TUNNEL RELEASE    . EYE SURGERY     in childhood to correct muscle imbalance  . HEMORROIDECTOMY    . NECK SURGERY  2007   bone spur C5-6  . ORIF TIBIA PLATEAU Right 12/08/2017   Procedure: OPEN REDUCTION INTERNAL FIXATION (ORIF) LATERAL  TIBIAL PLATEAU;  Surgeon: Yolonda Kidaogers, Jason Patrick, MD;  [facility];  Service: Orthopedics;  Laterality: Right;  . TYMPANOSTOMY TUBE PLACEMENT    . VASECTOMY     Allergies  Allergen Reactions  . Other Hives and Swelling    Tree nuts   No outpatient medications have been marked as taking for the 11/26/18 encounter (Office Visit) with Wynn BankerKoberlein, Lanae Federer C, MD.   Social History   Tobacco Use  . Smoking status: Former Smoker    Packs/day: 2.00    Years: 20.00    Pack years: 40.00    Types: E-cigarettes, Cigarettes  . Smokeless tobacco: Never Used  Substance Use Topics  . Alcohol use: Yes    Alcohol/week: 14.0 standard drinks    Types: 14 Cans of beer per week    Comment: 3-4 per week   Family History  Problem Relation Age of Onset  . Colon cancer Mother 4765       dx at 5065  . High blood pressure Mother   . Hyperlipidemia Father   . High blood pressure Father   . Healthy Maternal Grandmother   . Healthy Maternal Grandfather        COVID  . Hypertension Paternal Grandmother   . Heart disease Paternal Grandmother   . Hypertension Paternal Grandfather   . Heart disease Paternal Grandfather   . Alcohol abuse Other        Grandparent  . Colon polyps Neg Hx   . Esophageal cancer Neg  Hx   . Rectal cancer Neg Hx   . Stomach cancer Neg Hx      Review of Systems  Constitutional: Negative for chills, fatigue and fever.  Respiratory: Negative for cough, chest tightness, shortness of breath and wheezing.   Cardiovascular: Negative for chest pain, palpitations and leg swelling.    Objective:  There were no vitals taken for this visit.      BP Readings from Last 3 Encounters:  07/10/18 (!) 158/92  07/02/18 120/82  05/31/18 120/90   Wt Readings from Last 3 Encounters:  07/10/18 237 lb 6 oz (107.7 kg)  07/02/18 237 lb 3.2 oz (107.6 kg)  05/31/18 233 lb 14.4 oz (106.1 kg)    EXAM: (video feed not working; visit complete with audio portion only)  GENERAL: alert,  oriented, appears well and in no acute distress  LUNGS: no difficulty with breathing during conversation.  PSYCH/NEURO: pleasant and cooperative, no obvious depression or anxiety, speech and thought processing grossly intact   Reviewed all personal and family history with patient  Assessment/Plan  1. Hyperlipidemia, unspecified hyperlipidemia type Trying to get back on track with healthy eating. Has follow up appointment with Flanagan in October. Will be due for physical in January.  2. Hyperglycemia See above. Discussed portion control today and importance of daily exercise.  3. Depression, major, single episode, moderate (HCC) Mood has been stable. Continue zoloft.    Return in about 3 months (around 02/26/2019) for Chronic condition visit with HK.   I discussed the assessment and treatment plan with the patient. The patient was provided an opportunity to ask questions and all were answered. The patient agreed with the plan and demonstrated an understanding of the instructions.   The patient was advised to call back or seek an in-person evaluation if the symptoms worsen or if the condition fails to improve as anticipated.  I provided 20 minutes of non-face-to-face time during this encounter.   Micheline Rough, MD

## 2018-11-26 NOTE — Telephone Encounter (Signed)
-----   Message from Caren Macadam, MD sent at 11/26/2018  8:37 AM EDT ----- 3-4 mo CCV follow up with HK please.

## 2018-11-26 NOTE — Telephone Encounter (Signed)
Appt scheduled for 10/27 at 10am.

## 2018-12-08 ENCOUNTER — Other Ambulatory Visit: Payer: Self-pay | Admitting: Family Medicine

## 2019-02-06 ENCOUNTER — Other Ambulatory Visit: Payer: Self-pay | Admitting: Family Medicine

## 2019-02-26 ENCOUNTER — Encounter: Payer: Self-pay | Admitting: Family Medicine

## 2019-02-26 ENCOUNTER — Ambulatory Visit (INDEPENDENT_AMBULATORY_CARE_PROVIDER_SITE_OTHER): Payer: Managed Care, Other (non HMO) | Admitting: Family Medicine

## 2019-02-26 ENCOUNTER — Other Ambulatory Visit: Payer: Self-pay

## 2019-02-26 DIAGNOSIS — E785 Hyperlipidemia, unspecified: Secondary | ICD-10-CM | POA: Diagnosis not present

## 2019-02-26 DIAGNOSIS — R739 Hyperglycemia, unspecified: Secondary | ICD-10-CM

## 2019-02-26 DIAGNOSIS — I10 Essential (primary) hypertension: Secondary | ICD-10-CM | POA: Diagnosis not present

## 2019-02-26 DIAGNOSIS — F3340 Major depressive disorder, recurrent, in remission, unspecified: Secondary | ICD-10-CM

## 2019-02-26 MED ORDER — AMLODIPINE BESYLATE 5 MG PO TABS: 7.5000 mg | ORAL_TABLET | Freq: Every day | ORAL | 1 refills | Status: DC

## 2019-02-26 NOTE — Patient Instructions (Signed)
-  increase Norvasc to 7.5 mg daily  -increase aerobic exercise  -eat a healthy diet  -set up a lab appointment for fasting labs  -get a blood pressure cuff and monitor blood pressure  -follow up in 1 month

## 2019-02-26 NOTE — Progress Notes (Signed)
Virtual Visit via Video Note  I connected with Timothy Jennings  on 02/26/19 at 10:00 AM EDT by a video enabled telemedicine application and verified that I am speaking with the correct person using two identifiers.  Location patient: home Location provider:work or home office Persons participating in the virtual visit: patient, provider  HPI:  Seen for follow up:  Hyperlipidemia/Hyperglycemia: -meds:taking pravastin -lifestyle:exercise not as good lately; trying hello fresh which is helping some, gets in a lot of walking at work -denies:CP, SOB, swelling  Hypertension: -BP check recently was 130/100 when checked at blood collection -he does not have a cuff at home -denies:CP, SOB, swelling  Depression: -sees Timothy Jennings for CBT -meds: zoloft started 4.2020 -reports mood is doing good, continues the zoloft  ROS: See pertinent positives and negatives per HPI.  Past Medical History:  Diagnosis Date  . Allergy   . Chicken pox   . Hyperlipidemia 02/21/2012  . 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 Otitis media    followed by ENT   . Trigger finger, acquired 07/01/2015   Involves 5th MCP on RT with nodule noted on [place]  Thumb may rarely trigger     Past Surgical History:  Procedure Laterality Date  . BILATERAL CARPAL TUNNEL RELEASE    . EYE SURGERY     in childhood to correct muscle imbalance  . HEMORROIDECTOMY    . NECK SURGERY  2007   bone spur C5-6  . ORIF TIBIA PLATEAU Right 12/08/2017   Procedure: OPEN REDUCTION INTERNAL FIXATION (ORIF) LATERAL TIBIAL PLATEAU;  Surgeon: Nicholes Stairs, MD;  Location: [place];  Service: Orthopedics;  Laterality: Right;  . TYMPANOSTOMY TUBE PLACEMENT    . VASECTOMY      Family History  Problem Relation Age of Onset  . Colon cancer Mother 63       dx at 17  . High blood pressure Mother   . Hyperlipidemia Father   . High blood pressure Father   . Healthy Maternal Grandmother   . Healthy  Maternal Grandfather        COVID  . Hypertension Paternal Grandmother   . Heart disease Paternal Grandmother   . Hypertension Paternal Grandfather   . Heart disease Paternal Grandfather   . Alcohol abuse Other        Grandparent  . Colon polyps Neg Hx   . Esophageal cancer Neg Hx   . Rectal cancer Neg Hx   . Stomach cancer Neg Hx     SOCIAL HX: see hpi   Current Outpatient Medications:  .  amLODipine (NORVASC) 5 MG tablet, Take 1.5 tablets (7.5 mg total) by mouth daily., Disp: 135 tablet, Rfl: 1 .  diphenhydrAMINE (BENADRYL ALLERGY) 25 mg capsule, Take 25 mg by mouth daily as needed for allergies. , Disp: , Rfl:  .  loratadine (CLARITIN) 10 MG tablet, Take 10 mg by mouth daily., Disp: , Rfl:  .  pravastatin (PRAVACHOL) 40 MG tablet, TAKE 1 TABLET BY MOUTH DAILY, Disp: 90 tablet, Rfl: 2 .  sertraline (ZOLOFT) 50 MG tablet, TAKE 1 TABLET(50 MG) BY MOUTH DAILY, Disp: 90 tablet, Rfl: 1 .  sildenafil (VIAGRA) 25 MG tablet, TAKE 1 TO 2 TABLETS BY MOUTH AS NEEDED FOR SEXUAL ACTIVITY, Disp: 10 tablet, Rfl: 0  EXAM:  VITALS per patient if applicable: There were no vitals filed for this visit. There is no height or weight on file to calculate BMI.  GENERAL: alert, oriented, appears well and in no acute distress  HEENT: atraumatic, conjunttiva clear, no obvious abnormalities on inspection of external nose and ears  NECK: normal movements of the head and neck  LUNGS: on inspection no signs of respiratory distress, breathing rate appears normal, no obvious gross SOB, gasping or wheezing  CV: no obvious cyanosis  MS: moves all visible extremities without noticeable abnormality  PSYCH/NEURO: pleasant and cooperative, no obvious depression or anxiety, speech and thought processing grossly intact  ASSESSMENT AND PLAN:  Discussed the following assessment and plan:  Hyperglycemia - Plan: Hemoglobin A1c  Hyperlipidemia, unspecified hyperlipidemia type - Plan: Lipid  panel  Depression, major, recurrent, in remission (HCC)  Essential hypertension - Plan: Basic metabolic panel, CBC  -due for labs and flu shot, ordered labs, he is set up for his flu shot in our vaccine clinic -for his hypertension: increased norvasc to 7.5mg , increase aerobic exercise, labs, he agrees to get a cuff to monitor at home, follow up 1 month -mood is good -labs per orders fasting  I discussed the assessment and treatment plan with the patient. The patient was provided an opportunity to ask questions and all were answered. The patient agreed with the plan and demonstrated an understanding of the instructions.   The patient was advised to call back or seek an in-person evaluation if the symptoms worsen or if the condition fails to improve as anticipated.   Terressa Koyanagi, DO

## 2019-03-08 ENCOUNTER — Other Ambulatory Visit (INDEPENDENT_AMBULATORY_CARE_PROVIDER_SITE_OTHER): Payer: Managed Care, Other (non HMO)

## 2019-03-08 ENCOUNTER — Other Ambulatory Visit: Payer: Self-pay

## 2019-03-08 ENCOUNTER — Other Ambulatory Visit: Payer: Self-pay | Admitting: Family Medicine

## 2019-03-08 DIAGNOSIS — R739 Hyperglycemia, unspecified: Secondary | ICD-10-CM | POA: Diagnosis not present

## 2019-03-08 DIAGNOSIS — E785 Hyperlipidemia, unspecified: Secondary | ICD-10-CM

## 2019-03-08 DIAGNOSIS — I10 Essential (primary) hypertension: Secondary | ICD-10-CM

## 2019-03-08 LAB — BASIC METABOLIC PANEL
BUN: 18 mg/dL (ref 6–23)
CO2: 28 mEq/L (ref 19–32)
Calcium: 9 mg/dL (ref 8.4–10.5)
Chloride: 102 mEq/L (ref 96–112)
Creatinine, Ser: 1 mg/dL (ref 0.40–1.50)
GFR: 78.55 mL/min (ref >60.00)
Glucose, Bld: 90 mg/dL (ref 70–99)
Potassium: 4.1 mEq/L (ref 3.5–5.1)
Sodium: 137 mEq/L (ref 135–145)

## 2019-03-08 LAB — LIPID PANEL
Cholesterol: 206 mg/dL — ABNORMAL HIGH (ref 0–200)
HDL: 37.5 mg/dL — ABNORMAL LOW (ref >39.00)
NonHDL: 168.6
Total CHOL/HDL Ratio: 5
Triglycerides: 378 mg/dL — ABNORMAL HIGH (ref 0.0–149.0)
VLDL: 75.6 mg/dL — ABNORMAL HIGH (ref 0.0–40.0)

## 2019-03-08 LAB — CBC
HCT: 41.3 % (ref 39.0–52.0)
Hemoglobin: 13.9 g/dL (ref 13.0–17.0)
MCHC: 33.6 g/dL (ref 30.0–36.0)
MCV: 90.5 fl (ref 78.0–100.0)
Platelets: 224 10*3/uL (ref 150.0–400.0)
RBC: 4.56 Mil/uL (ref 4.22–5.81)
RDW: 14.3 % (ref 11.5–15.5)
WBC: 6.9 10*3/uL (ref 4.0–10.5)

## 2019-03-08 LAB — LDL CHOLESTEROL, DIRECT: Direct LDL: 116 mg/dL

## 2019-03-08 LAB — HEMOGLOBIN A1C: Hgb A1c MFr Bld: 5.7 % (ref 4.6–6.5)

## 2019-03-09 ENCOUNTER — Ambulatory Visit: Payer: Managed Care, Other (non HMO)

## 2019-03-19 ENCOUNTER — Other Ambulatory Visit: Payer: Self-pay | Admitting: Family Medicine

## 2019-03-19 MED ORDER — FENOFIBRATE 50 MG PO CAPS: 50.0000 mg | ORAL_CAPSULE | Freq: Every day | ORAL | 1 refills | Status: DC

## 2019-04-02 ENCOUNTER — Telehealth (INDEPENDENT_AMBULATORY_CARE_PROVIDER_SITE_OTHER): Payer: Managed Care, Other (non HMO) | Admitting: Family Medicine

## 2019-04-02 DIAGNOSIS — R030 Elevated blood-pressure reading, without diagnosis of hypertension: Secondary | ICD-10-CM

## 2019-04-02 DIAGNOSIS — F325 Major depressive disorder, single episode, in full remission: Secondary | ICD-10-CM

## 2019-04-02 DIAGNOSIS — Z6832 Body mass index (BMI) 32.0-32.9, adult: Secondary | ICD-10-CM

## 2019-04-02 DIAGNOSIS — E785 Hyperlipidemia, unspecified: Secondary | ICD-10-CM

## 2019-04-02 MED ORDER — AMLODIPINE BESYLATE 5 MG PO TABS: 10.0000 mg | ORAL_TABLET | Freq: Every day | ORAL | 1 refills | Status: DC

## 2019-04-02 MED ORDER — HYDROCHLOROTHIAZIDE 12.5 MG PO TABS: 12.5000 mg | ORAL_TABLET | Freq: Every day | ORAL | 3 refills | Status: DC

## 2019-04-02 NOTE — Patient Instructions (Signed)
-  increase the Norvasc (amlodipine) to 10mg  daily. I sent a new dose to the pharmacy.  -monitor BP if not coming close to goal of 120/70 over the next week, add the HCTZ(hydrochlorthiazide) 12.5 mg daily in the morning  -eat a healthy diet and continue to get regular exercise  -follow up with Dr. Maudie Mercury or Dr. Ethlyn Gallery in 2 months, will plan to order labs then

## 2019-04-02 NOTE — Progress Notes (Signed)
Virtual Visit via Video Note  I connected with Timothy Jennings  on 04/02/19 at 10:20 AM EST by a video enabled telemedicine application and verified that I am speaking with the correct person using two identifiers.  Location patient: home Location provider:work or home office Persons participating in the virtual visit: patient, provider  I discussed the limitations of evaluation and management by telemedicine and the availability of in person appointments. The patient expressed understanding and agreed to proceed.   HPI:  Seen in follow up for mood disorder, HTN, HLD. BP has been running in the 140s/100 on average. Taking norvasc 7.5mg  daily. Denies CP, SOB, headaches, vision changes. 10,000 steps per day. Diet is ok - not terrible. He plans to get his flu shot done soon at [facility].  PCP started fenofibrate recently for his triglycerides. Reports mood has been good and no longer needing CBT. Continues the zoloft.  ROS: See pertinent positives and negatives per HPI.  Past Medical History:  Diagnosis Date  . Allergy   . Chicken pox   . Hyperlipidemia 02/21/2012  . 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 Otitis media    followed by ENT   . Trigger finger, acquired 07/01/2015   Involves 5th MCP on RT with nodule noted on [place]  Thumb may rarely trigger     Past Surgical History:  Procedure Laterality Date  . BILATERAL CARPAL TUNNEL RELEASE    . EYE SURGERY     in childhood to correct muscle imbalance  . HEMORROIDECTOMY    . NECK SURGERY  2007   bone spur C5-6  . ORIF TIBIA PLATEAU Right 12/08/2017   Procedure: OPEN REDUCTION INTERNAL FIXATION (ORIF) LATERAL TIBIAL PLATEAU;  Surgeon: Nicholes Stairs, MD;  Location: [facility];  Service: Orthopedics;  Laterality: Right;  . TYMPANOSTOMY TUBE PLACEMENT    . VASECTOMY      Family History  Problem Relation Age of Onset  . Colon cancer Mother 62       dx at 64  . High blood pressure Mother   .  Hyperlipidemia Father   . High blood pressure Father   . Healthy Maternal Grandmother   . Healthy Maternal Grandfather        COVID  . Hypertension Paternal Grandmother   . Heart disease Paternal Grandmother   . Hypertension Paternal Grandfather   . Heart disease Paternal Grandfather   . Alcohol abuse Other        Grandparent  . Colon polyps Neg Hx   . Esophageal cancer Neg Hx   . Rectal cancer Neg Hx   . Stomach cancer Neg Hx     SOCIAL HX: see hpi   Current Outpatient Medications:  .  amLODipine (NORVASC) 5 MG tablet, Take 2 tablets (10 mg total) by mouth daily., Disp: 90 tablet, Rfl: 1 .  diphenhydrAMINE (BENADRYL ALLERGY) 25 mg capsule, Take 25 mg by mouth daily as needed for allergies. , Disp: , Rfl:  .  Fenofibrate 50 MG CAPS, Take 1 capsule (50 mg total) by mouth daily., Disp: 90 capsule, Rfl: 1 .  hydrochlorothiazide (HYDRODIURIL) 12.5 MG tablet, Take 1 tablet (12.5 mg total) by mouth daily., Disp: 90 tablet, Rfl: 3 .  loratadine (CLARITIN) 10 MG tablet, Take 10 mg by mouth daily., Disp: , Rfl:  .  pravastatin (PRAVACHOL) 40 MG tablet, TAKE 1 TABLET BY MOUTH DAILY, Disp: 90 tablet, Rfl: 2 .  sertraline (ZOLOFT) 50 MG tablet,  TAKE 1 TABLET(50 MG) BY MOUTH DAILY, Disp: 90 tablet, Rfl: 1 .  sildenafil (VIAGRA) 25 MG tablet, TAKE 1 TO 2 TABLETS BY MOUTH AS NEEDED FOR SEXUAL ACTIVITY, Disp: 10 tablet, Rfl: 0  EXAM:  VITALS per patient if applicable:see hpi   GENERAL: alert, oriented, appears well and in no acute distress  HEENT: atraumatic, conjunttiva clear, no obvious abnormalities on inspection of external nose and ears  NECK: normal movements of the head and neck  LUNGS: on inspection no signs of respiratory distress, breathing rate appears normal, no obvious gross SOB, gasping or wheezing  CV: no obvious cyanosis  MS: moves all visible extremities without noticeable abnormality  PSYCH/NEURO: pleasant and cooperative, no obvious depression or anxiety, speech and  thought processing grossly intact  ASSESSMENT AND PLAN:  Discussed the following assessment and plan:  Elevated blood pressure reading  BMI 32.0-32.9,adult  Hyperlipidemia, unspecified hyperlipidemia type  Depression, major, single episode, complete remission (HCC)  -discussed BP goals, options for management, risks -advised healthy diet and regular exercise -increased norvasc to 10mg  -add hctz 12.5 if still not at goal -flu shot advised - he plans to do at [facility] -advised precautions with COVID19 pandemic, masking etc -follow up and labs in 2 months   I discussed the assessment and treatment plan with the patient. The patient was provided an opportunity to ask questions and all were answered. The patient agreed with the plan and demonstrated an understanding of the instructions.   The patient was advised to call back or seek an in-person evaluation if the symptoms worsen or if the condition fails to improve as anticipated.   , DO

## 2019-04-21 ENCOUNTER — Other Ambulatory Visit: Payer: Self-pay | Admitting: Family Medicine

## 2019-05-27 ENCOUNTER — Other Ambulatory Visit: Payer: Managed Care, Other (non HMO)

## 2019-05-29 ENCOUNTER — Other Ambulatory Visit (INDEPENDENT_AMBULATORY_CARE_PROVIDER_SITE_OTHER): Payer: Managed Care, Other (non HMO)

## 2019-05-29 ENCOUNTER — Other Ambulatory Visit: Payer: Self-pay

## 2019-05-29 DIAGNOSIS — E785 Hyperlipidemia, unspecified: Secondary | ICD-10-CM

## 2019-05-29 LAB — LIPID PANEL
Cholesterol: 207 mg/dL — ABNORMAL HIGH (ref 0–200)
HDL: 36.6 mg/dL — ABNORMAL LOW (ref >39.00)
NonHDL: 170.42
Total CHOL/HDL Ratio: 6
Triglycerides: 274 mg/dL — ABNORMAL HIGH (ref 0.0–149.0)
VLDL: 54.8 mg/dL — ABNORMAL HIGH (ref 0.0–40.0)

## 2019-05-29 LAB — LDL CHOLESTEROL, DIRECT: Direct LDL: 128 mg/dL

## 2019-06-03 ENCOUNTER — Ambulatory Visit: Payer: Managed Care, Other (non HMO) | Admitting: Family Medicine

## 2019-06-06 ENCOUNTER — Other Ambulatory Visit: Payer: Self-pay

## 2019-06-07 ENCOUNTER — Ambulatory Visit (INDEPENDENT_AMBULATORY_CARE_PROVIDER_SITE_OTHER): Payer: Managed Care, Other (non HMO) | Admitting: Family Medicine

## 2019-06-07 ENCOUNTER — Encounter: Payer: Self-pay | Admitting: Family Medicine

## 2019-06-07 VITALS — BP 120/82 | HR 82 | Temp 98.3°F | Ht 70.0 in | Wt 242.6 lb

## 2019-06-07 DIAGNOSIS — R0683 Snoring: Secondary | ICD-10-CM | POA: Diagnosis not present

## 2019-06-07 DIAGNOSIS — R030 Elevated blood-pressure reading, without diagnosis of hypertension: Secondary | ICD-10-CM

## 2019-06-07 DIAGNOSIS — E785 Hyperlipidemia, unspecified: Secondary | ICD-10-CM | POA: Diagnosis not present

## 2019-06-07 DIAGNOSIS — Z23 Encounter for immunization: Secondary | ICD-10-CM | POA: Diagnosis not present

## 2019-06-07 MED ORDER — FLUTICASONE PROPIONATE 50 MCG/ACT NA SUSP: 2.0000 | Freq: Every day | NASAL | 6 refills | Status: DC

## 2019-06-07 MED ORDER — CETIRIZINE HCL 10 MG PO TABS: 10.0000 mg | ORAL_TABLET | Freq: Every day | ORAL | 11 refills | Status: DC

## 2019-06-07 MED ORDER — AMLODIPINE BESYLATE 10 MG PO TABS: 10.0000 mg | ORAL_TABLET | Freq: Every day | ORAL | 1 refills | Status: DC

## 2019-06-07 NOTE — Progress Notes (Signed)
Timothy Jennings DOB: 1965-07-12 Encounter date: 06/07/2019  This is a 54 y.o. male who presents with Chief Complaint  Patient presents with  . Follow-up    History of present illness: Has been tracking bp; has come down since addition of blood pressure medication.  Usually 130-140/90-100. Today 137/91 at lunch. Yesterday 148/102 in morning. In jan 147/99. Home cuff is digital arm cuff. Hasn't seen anything lower than today's readings at home. Lowest diastolic was 71. No headaches, no chest pain, no chest pressure.   Allergies: back and forth. Taking zyrtec now.   Mood has been doing ok. Still taking zoloft 50mg  daily. Not in therapy.   HL: still doing fenofibrate, pravastatin for cholesterol.   Gets between 10-13000 steps daily; up and down stairs all day.   140/80 on recheck.   12oz daily soda.   Had sleep study years ago; wife sleeps separate from him. Snoring worse in winter. Has been told he stops breathing.   Allergies  Allergen Reactions  . Other Hives and Swelling    Tree nuts   Current Meds  Medication Sig  . amLODipine (NORVASC) 5 MG tablet Take 2 tablets (10 mg total) by mouth daily.  . Fenofibrate 50 MG CAPS Take 1 capsule (50 mg total) by mouth daily.  . hydrochlorothiazide (HYDRODIURIL) 12.5 MG tablet Take 1 tablet (12.5 mg total) by mouth daily.  . pravastatin (PRAVACHOL) 40 MG tablet TAKE 1 TABLET BY MOUTH DAILY  . sertraline (ZOLOFT) 50 MG tablet TAKE 1 TABLET(50 MG) BY MOUTH DAILY  . sildenafil (VIAGRA) 25 MG tablet TAKE 1 TO 2 TABLETS BY MOUTH AS NEEDED FOR SEXUAL ACTIVITY  . [DISCONTINUED] diphenhydrAMINE (BENADRYL ALLERGY) 25 mg capsule Take 25 mg by mouth daily as needed for allergies.   . [DISCONTINUED] loratadine (CLARITIN) 10 MG tablet Take 10 mg by mouth daily.    Review of Systems  Constitutional: Negative for chills, fatigue and fever.  Respiratory: Negative for cough, chest tightness, shortness of breath and wheezing.   Cardiovascular:  Negative for chest pain, palpitations and leg swelling.    Objective:  BP 120/82 (BP Location: Left Arm, Patient Position: Sitting, Cuff Size: Large)   Pulse 82   Temp 98.3 F (36.8 C) (Temporal)   Ht 5\' 10"  (1.778 m)   Wt 242 lb 9.6 oz (110 kg)   SpO2 95%   BMI 34.81 kg/m   Weight: 242 lb 9.6 oz (110 kg)   BP Readings from Last 3 Encounters:  06/07/19 120/82  07/10/18 (!) 158/92  07/02/18 120/82   Wt Readings from Last 3 Encounters:  06/07/19 242 lb 9.6 oz (110 kg)  07/10/18 237 lb 6 oz (107.7 kg)  07/02/18 237 lb 3.2 oz (107.6 kg)    Physical Exam Constitutional:      General: He is not in acute distress.    Appearance: He is well-developed.  Cardiovascular:     Rate and Rhythm: Normal rate and regular rhythm.     Heart sounds: Normal heart sounds. No murmur. No friction rub.  Pulmonary:     Effort: Pulmonary effort is normal. No respiratory distress.     Breath sounds: Normal breath sounds. No wheezing or rales.  Musculoskeletal:     Right lower leg: No edema.     Left lower leg: No edema.  Neurological:     Mental Status: He is alert and oriented to person, place, and time.  Psychiatric:        Behavior: Behavior normal.  Assessment/Plan  1. Hyperlipidemia, unspecified hyperlipidemia type Continue with pravastatin.  We discussed ways to lower cholesterol like limiting soda intake as well as beer intake.  We also discussed limiting carbohydrates. - Comprehensive metabolic panel; Future  2. Elevated blood pressure reading His blood pressure readings at home are elevated more than ours in the office today.  I would like for him to check at home and increase the hydrochlorothiazide if still running high at home.  We will have him do a short-term follow-up so we can compare his home cuff to our in office cuff. - CBC with Differential/Platelet; Future - Comprehensive metabolic panel; Future  3. Snoring Sleep is interrupted, snoring is significant enough that  wife has expressed concerns about it in the past.  We will refer to evaluate for sleep apnea.  We discussed that this can affect health in multiple ways. - Ambulatory referral to Sleep Studies  4. Need for immunization against influenza - Flu Vaccine QUAD 6+ mos PF IM (Fluarix Quad PF)   Return in about 2 months (around 08/05/2019) for Chronic condition visit with bloodwork prior.  We reviewed bloodwork together as well.   Theodis Shove, MD

## 2019-06-07 NOTE — Patient Instructions (Addendum)
Work on cutting out soda and cutting back beer.   Exercise on days off work.   Continue to monitor blood pressure at home. If elevated over 130 systolic most days, then increase the hydrochlorothiazide to 2 tablets (total of 25mg )
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
## 2019-06-21 ENCOUNTER — Other Ambulatory Visit: Payer: Self-pay | Admitting: Family Medicine

## 2019-06-28 ENCOUNTER — Encounter: Payer: Self-pay | Admitting: Family Medicine

## 2019-07-15 ENCOUNTER — Other Ambulatory Visit: Payer: Self-pay

## 2019-07-15 ENCOUNTER — Ambulatory Visit: Payer: Managed Care, Other (non HMO) | Admitting: Pulmonary Disease

## 2019-07-15 ENCOUNTER — Encounter: Payer: Self-pay | Admitting: Pulmonary Disease

## 2019-07-15 VITALS — BP 128/80 | HR 80 | Temp 97.8°F | Ht 70.0 in | Wt 242.8 lb

## 2019-07-15 DIAGNOSIS — G4733 Obstructive sleep apnea (adult) (pediatric): Secondary | ICD-10-CM

## 2019-07-15 NOTE — Progress Notes (Signed)
Timothy Jennings    [id number]    Apr 04, 1966  Primary Care Physician:Koberlein, Paris Lore, MD  Referring Physician: Wynn Banker, MD [id number] [place],  [place] [address]   Chief complaint:   Patient being seen for history of snoring, witnessed apneas  HPI:  He had had a sleep study done many years back, could not recollect when he was done He was told he had sleep apnea then Was not started on any treatment  He does have snoring, witnessed apneas, wakes up feeling like his been snoring Some dryness of his mouth He does have night sweats Memory is good Denies morning headaches No obstructive sleep apnea in the family, dad snored heavily  He has trouble staying asleep, wakes up about 2:58 AM and not able to fall back to sleep Sleep time is between 8 and 9 PM Takes him about 10 to 30 minutes to fall asleep 4-5 awakenings per night Final wake up time usually about 5 AM  Has gained about 30 pounds recently he has hypertension, hypercholesterolemia  Quit smoking about 6 years ago   Outpatient Encounter Medications as of 07/15/2019  Medication Sig  . amLODipine (NORVASC) 10 MG tablet Take 1 tablet (10 mg total) by mouth daily.  . cetirizine (ZYRTEC) 10 MG tablet Take 1 tablet (10 mg total) by mouth daily.  . Fenofibrate 50 MG CAPS Take 1 capsule (50 mg total) by mouth daily.  . fluticasone (FLONASE) 50 MCG/ACT nasal spray Place 2 sprays into both nostrils daily.  . hydrochlorothiazide (HYDRODIURIL) 12.5 MG tablet Take 1 tablet (12.5 mg total) by mouth daily.  . pravastatin (PRAVACHOL) 40 MG tablet TAKE 1 TABLET BY MOUTH DAILY  . sertraline (ZOLOFT) 50 MG tablet TAKE 1 TABLET(50 MG) BY MOUTH DAILY  . sildenafil (VIAGRA) 25 MG tablet TAKE 1 TO 2 TABLETS BY MOUTH AS NEEDED FOR SEXUAL ACTIVITY   No facility-administered encounter medications on file as of 07/15/2019.    Allergies as of 07/15/2019 - Review Complete 07/15/2019  Allergen Reaction Noted    . Other Hives and Swelling 07/02/2018    Past Medical History:  Diagnosis Date  . Allergy   . Chicken pox   . Hyperlipidemia 02/21/2012  . 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 Otitis media    followed by ENT   . Trigger finger, acquired 07/01/2015   Involves 5th MCP on RT with nodule noted on [place]  Thumb may rarely trigger     Past Surgical History:  Procedure Laterality Date  . BILATERAL CARPAL TUNNEL RELEASE    . EYE SURGERY     in childhood to correct muscle imbalance  . HEMORROIDECTOMY    . NECK SURGERY  2007   bone spur C5-6  . ORIF TIBIA PLATEAU Right 12/08/2017   Procedure: OPEN REDUCTION INTERNAL FIXATION (ORIF) LATERAL TIBIAL PLATEAU;  Surgeon: Yolonda Kida, MD;  Location: [facility];  Service: Orthopedics;  Laterality: Right;  . TYMPANOSTOMY TUBE PLACEMENT    . VASECTOMY      Family History  Problem Relation Age of Onset  . Colon cancer Mother 59       dx at 64  . High blood pressure Mother   . Hyperlipidemia Father   . High blood pressure Father   . Healthy Maternal Grandmother   . Healthy Maternal Grandfather        COVID  .  Hypertension Paternal Grandmother   . Heart disease Paternal Grandmother   . Hypertension Paternal Grandfather   . Heart disease Paternal Grandfather   . Alcohol abuse Other        Grandparent  . Colon polyps Neg Hx   . Esophageal cancer Neg Hx   . Rectal cancer Neg Hx   . Stomach cancer Neg Hx     Social History   Socioeconomic History  . Marital status: Married    Spouse name: Not on file  . Number of children: Not on file  . Years of education: Not on file  . Highest education level: Not on file  Occupational History  . Not on file  Tobacco Use  . Smoking status: Former Smoker    Packs/day: 2.00    Years: 20.00    Pack years: 40.00    Types: E-cigarettes, Cigarettes  . Smokeless tobacco: Never Used  Substance and Sexual Activity  . Alcohol use: Yes     Alcohol/week: 14.0 standard drinks    Types: 14 Cans of beer per week    Comment: 3-4 per week  . Drug use: No  . Sexual activity: Not on file  Other Topics Concern  . Not on file  Social History Narrative  . Not on file   Social Determinants of Health   Financial Resource Strain:   . Difficulty of Paying Living Expenses:   Food Insecurity:   . Worried About Programme researcher, broadcasting/film/video in the Last Year:   . Barista in the Last Year:   Transportation Needs:   . Freight forwarder (Medical):   Marland Kitchen Lack of Transportation (Non-Medical):   Physical Activity:   . Days of Exercise per Week:   . Minutes of Exercise per Session:   Stress:   . Feeling of Stress :   Social Connections:   . Frequency of Communication with Friends and Family:   . Frequency of Social Gatherings with Friends and Family:   . Attends Religious Services:   . Active Member of Clubs or Organizations:   . Attends Banker Meetings:   Marland Kitchen Marital Status:   Intimate Partner Violence:   . Fear of Current or Ex-Partner:   . Emotionally Abused:   Marland Kitchen Physically Abused:   . Sexually Abused:     Review of Systems  Respiratory: Positive for apnea.   Psychiatric/Behavioral: Positive for sleep disturbance.  All other systems reviewed and are negative.   Vitals:   07/15/19 1027  BP: 128/80  Pulse: 80  Temp: 97.8 F (36.6 C)  SpO2: 93%    Results of the Epworth flowsheet 07/15/2019  Sitting and reading 2  Watching TV 3  Sitting, inactive in a public place (e.g. a theatre or a meeting) 0  As a passenger in a car for an hour without a break 0  Lying down to rest in the afternoon when circumstances permit 3  Sitting and talking to someone 0  Sitting quietly after a lunch without alcohol 0  In a car, while stopped for a few minutes in traffic 0  Total score 8    Physical Exam  Constitutional: He appears well-developed and well-nourished.  HENT:  Head: Normocephalic and atraumatic.    Macroglossia, Mallampati 4, crowded oropharynx  Eyes: Pupils are equal, round, and reactive to light. Right eye exhibits no discharge. Left eye exhibits no discharge.  Neck: No thyromegaly present.  Cardiovascular: Normal rate and regular rhythm.  Pulmonary/Chest: Effort normal  and breath sounds normal. No respiratory distress. He has no wheezes. He has no rales. He exhibits no tenderness.  Musculoskeletal:        General: No edema. Normal range of motion.     Cervical back: Normal range of motion and neck supple.  Neurological: He is alert.  Skin: Skin is warm and dry. No erythema.  Psychiatric: He has a normal mood and affect.   Assessment:  High probability of significant obstructive sleep apnea -Significant daytime symptoms -Significant nighttime symptoms suggesting presence of sleep disordered breathing  Excessive daytime sleepiness  Obesity  Plan/Recommendations: Pathophysiology of sleep disordered breathing discussed with the patient Treatment options for sleep disordered breathing discussed with the patient  We will schedule the patient for a home sleep study  Treatment options as discussed  I will see him back in about 3 months  Importance of of weight management discussed   Sherrilyn Rist MD [facility] 07/15/2019, 10:45 AM  CC: Caren Macadam, MD

## 2019-07-15 NOTE — Patient Instructions (Signed)
High probability of significant obstructive sleep apnea  We will schedule you for home sleep study  Treatment options as discussed  We will see you back in about 3 months  Call with any significant concerns   Sleep Apnea Sleep apnea is a condition in which breathing pauses or becomes shallow during sleep. Episodes of sleep apnea usually last 10 seconds or longer, and they may occur as many as 20 times an hour. Sleep apnea disrupts your sleep and keeps your body from getting the rest that it needs. This condition can increase your risk of certain health problems, including:  Heart attack.  Stroke.  Obesity.  Diabetes.  Heart failure.  Irregular heartbeat. What are the causes? There are three kinds of sleep apnea:  Obstructive sleep apnea. This kind is caused by a blocked or collapsed airway.  Central sleep apnea. This kind happens when the part of the brain that controls breathing does not send the correct signals to the muscles that control breathing.  Mixed sleep apnea. This is a combination of obstructive and central sleep apnea. The most common cause of this condition is a collapsed or blocked airway. An airway can collapse or become blocked if:  Your throat muscles are abnormally relaxed.  Your tongue and tonsils are larger than normal.  You are overweight.  Your airway is smaller than normal. What increases the risk? You are more likely to develop this condition if you:  Are overweight.  Smoke.  Have a smaller than normal airway.  Are elderly.  Are male.  Drink alcohol.  Take sedatives or tranquilizers.  Have a family history of sleep apnea. What are the signs or symptoms? Symptoms of this condition include:  Trouble staying asleep.  Daytime sleepiness and tiredness.  Irritability.  Loud snoring.  Morning headaches.  Trouble concentrating.  Forgetfulness.  Decreased interest in sex.  Unexplained sleepiness.  Mood  swings.  Personality changes.  Feelings of depression.  Waking up often during the night to urinate.  Dry mouth.  Sore throat. How is this diagnosed? This condition may be diagnosed with:  A medical history.  A physical exam.  A series of tests that are done while you are sleeping (sleep study). These tests are usually done in a sleep lab, but they may also be done at home. How is this treated? Treatment for this condition aims to restore normal breathing and to ease symptoms during sleep. It may involve managing health issues that can affect breathing, such as high blood pressure or obesity. Treatment may include:  Sleeping on your side.  Using a decongestant if you have nasal congestion.  Avoiding the use of depressants, including alcohol, sedatives, and narcotics.  Losing weight if you are overweight.  Making changes to your diet.  Quitting smoking.  Using a device to open your airway while you sleep, such as: ? An oral appliance. This is a custom-made mouthpiece that shifts your lower jaw forward. ? A continuous positive airway pressure (CPAP) device. This device blows air through a mask when you breathe out (exhale). ? A nasal expiratory positive airway pressure (EPAP) device. This device has valves that you put into each nostril. ? A bi-level positive airway pressure (BPAP) device. This device blows air through a mask when you breathe in (inhale) and breathe out (exhale).  Having surgery if other treatments do not work. During surgery, excess tissue is removed to create a wider airway. It is important to get treatment for sleep apnea. Without treatment, this  condition can lead to:  High blood pressure.  Coronary artery disease.  In men, an inability to achieve or maintain an erection (impotence).  Reduced thinking abilities. Follow these instructions at home: Lifestyle  Make any lifestyle changes that your health care provider recommends.  Eat a healthy,  well-balanced diet.  Take steps to lose weight if you are overweight.  Avoid using depressants, including alcohol, sedatives, and narcotics.  Do not use any products that contain nicotine or tobacco, such as cigarettes, e-cigarettes, and chewing tobacco. If you need help quitting, ask your health care provider. General instructions  Take over-the-counter and prescription medicines only as told by your health care provider.  If you were given a device to open your airway while you sleep, use it only as told by your health care provider.  If you are having surgery, make sure to tell your health care provider you have sleep apnea. You may need to bring your device with you.  Keep all follow-up visits as told by your health care provider. This is important. Contact a health care provider if:  The device that you received to open your airway during sleep is uncomfortable or does not seem to be working.  Your symptoms do not improve.  Your symptoms get worse. Get help right away if:  You develop: ? Chest pain. ? Shortness of breath. ? Discomfort in your back, arms, or stomach.  You have: ? Trouble speaking. ? Weakness on one side of your body. ? Drooping in your face. These symptoms may represent a serious problem that is an emergency. Do not wait to see if the symptoms will go away. Get medical help right away. Call your local emergency services (911 in the [place] Do not drive yourself to the hospital. Summary  Sleep apnea is a condition in which breathing pauses or becomes shallow during sleep.  The most common cause is a collapsed or blocked airway.  The goal of treatment is to restore normal breathing and to ease symptoms during sleep. This information is not intended to replace advice given to you by your health care provider. Make sure you discuss any questions you have with your health care provider. Document Revised: 10/03/2018 Document Reviewed: 12/12/2017 Elsevier  Patient Education  [place].

## 2019-08-08 ENCOUNTER — Other Ambulatory Visit: Payer: Self-pay

## 2019-08-09 ENCOUNTER — Other Ambulatory Visit (INDEPENDENT_AMBULATORY_CARE_PROVIDER_SITE_OTHER): Payer: Managed Care, Other (non HMO)

## 2019-08-09 DIAGNOSIS — E785 Hyperlipidemia, unspecified: Secondary | ICD-10-CM | POA: Diagnosis not present

## 2019-08-09 DIAGNOSIS — R030 Elevated blood-pressure reading, without diagnosis of hypertension: Secondary | ICD-10-CM | POA: Diagnosis not present

## 2019-08-09 LAB — COMPREHENSIVE METABOLIC PANEL
ALT: 28 U/L (ref 0–53)
AST: 26 U/L (ref 0–37)
Albumin: 4.4 g/dL (ref 3.5–5.2)
Alkaline Phosphatase: 80 U/L (ref 39–117)
BUN: 16 mg/dL (ref 6–23)
CO2: 28 mEq/L (ref 19–32)
Calcium: 9.2 mg/dL (ref 8.4–10.5)
Chloride: 99 mEq/L (ref 96–112)
Creatinine, Ser: 0.99 mg/dL (ref 0.40–1.50)
GFR: 79.33 mL/min (ref >60.00)
Glucose, Bld: 100 mg/dL — ABNORMAL HIGH (ref 70–99)
Potassium: 4.2 mEq/L (ref 3.5–5.1)
Sodium: 136 mEq/L (ref 135–145)
Total Bilirubin: 0.5 mg/dL (ref 0.2–1.2)
Total Protein: 7.1 g/dL (ref 6.0–8.3)

## 2019-08-09 LAB — CBC WITH DIFFERENTIAL/PLATELET
Basophils Absolute: 0.1 10*3/uL (ref 0.0–0.1)
Basophils Relative: 0.9 % (ref 0.0–3.0)
Eosinophils Absolute: 0.3 10*3/uL (ref 0.0–0.7)
Eosinophils Relative: 4.2 % (ref 0.0–5.0)
HCT: 39.4 % (ref 39.0–52.0)
Hemoglobin: 13.4 g/dL (ref 13.0–17.0)
Lymphocytes Relative: 18.6 % (ref 12.0–46.0)
Lymphs Abs: 1.2 10*3/uL (ref 0.7–4.0)
MCHC: 33.9 g/dL (ref 30.0–36.0)
MCV: 90.2 fl (ref 78.0–100.0)
Monocytes Absolute: 0.6 10*3/uL (ref 0.1–1.0)
Monocytes Relative: 9.7 % (ref 3.0–12.0)
Neutro Abs: 4.2 10*3/uL (ref 1.4–7.7)
Neutrophils Relative %: 66.6 % (ref 43.0–77.0)
Platelets: 226 10*3/uL (ref 150.0–400.0)
RBC: 4.37 Mil/uL (ref 4.22–5.81)
RDW: 13.4 % (ref 11.5–15.5)
WBC: 6.3 10*3/uL (ref 4.0–10.5)

## 2019-08-13 ENCOUNTER — Other Ambulatory Visit: Payer: Self-pay

## 2019-08-13 ENCOUNTER — Ambulatory Visit: Payer: Managed Care, Other (non HMO)

## 2019-08-13 DIAGNOSIS — G4733 Obstructive sleep apnea (adult) (pediatric): Secondary | ICD-10-CM

## 2019-08-15 ENCOUNTER — Other Ambulatory Visit: Payer: Self-pay

## 2019-08-15 DIAGNOSIS — G4733 Obstructive sleep apnea (adult) (pediatric): Secondary | ICD-10-CM

## 2019-08-16 ENCOUNTER — Ambulatory Visit (INDEPENDENT_AMBULATORY_CARE_PROVIDER_SITE_OTHER): Payer: Managed Care, Other (non HMO) | Admitting: Family Medicine

## 2019-08-16 ENCOUNTER — Telehealth: Payer: Self-pay | Admitting: Pulmonary Disease

## 2019-08-16 ENCOUNTER — Encounter: Payer: Self-pay | Admitting: Family Medicine

## 2019-08-16 VITALS — BP 130/80 | HR 89 | Temp 98.1°F | Ht 70.0 in | Wt 241.1 lb

## 2019-08-16 DIAGNOSIS — I10 Essential (primary) hypertension: Secondary | ICD-10-CM

## 2019-08-16 DIAGNOSIS — H6690 Otitis media, unspecified, unspecified ear: Secondary | ICD-10-CM

## 2019-08-16 DIAGNOSIS — G4733 Obstructive sleep apnea (adult) (pediatric): Secondary | ICD-10-CM

## 2019-08-16 DIAGNOSIS — M65349 Trigger finger, unspecified ring finger: Secondary | ICD-10-CM

## 2019-08-16 MED ORDER — CIPROFLOXACIN-DEXAMETHASONE 0.3-0.1 % OT SUSP: 4.0000 [drp] | Freq: Two times a day (BID) | OTIC | 0 refills | Status: DC

## 2019-08-16 NOTE — Telephone Encounter (Signed)
Patient contacted with results of recent sleep study. Per Dr. Wynona Neat,   Severe obstructive sleep apnea  Severe oxygen desaturations  Recommend in lab titration study for severe sleep disordered breathing with severe oxygen desaturations  Close clinical follow up with compliance monitoring to optimize therapeutic efficiency  Weight loss measures encouraged  Patient should be cautioned against driving when sleepy and against medications with sedative side effects.  Patient verbalized understanding of results and plan of care. Order placed for in lab titration study as well as 2 month follow up recall with Dr. Wynona Neat.

## 2019-08-16 NOTE — Progress Notes (Signed)
NAIN RUDD DOB: Mar 08, 1966 Encounter date: 08/16/2019  This is a 54 y.o. male who presents with No chief complaint on file.   History of present illness: This morning he got 148/100; last night 110/68. Takes his medications at lunch typically. This is about what he has been getting with checking his pressures.   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   Got first COVID vaccine.   Completed sleep study. Severe sleep apnea; they are calling to back to set up follow-up.  He is having some right hand pain.  Tender in the palm area.  He is getting some catching in his third and fourth digits with pain mostly in the fourth finger.   Allergies  Allergen Reactions  . Other Hives and Swelling    Tree nuts   Current Meds  Medication Sig  . amLODipine (NORVASC) 10 MG tablet Take 1 tablet (10 mg total) by mouth daily.  . cetirizine (ZYRTEC) 10 MG tablet Take 1 tablet (10 mg total) by mouth daily.  . Fenofibrate 50 MG CAPS Take 1 capsule (50 mg total) by mouth daily.  . fluticasone (FLONASE) 50 MCG/ACT nasal spray Place 2 sprays into both nostrils daily.  . hydrochlorothiazide (HYDRODIURIL) 12.5 MG tablet Take 1 tablet (12.5 mg total) by mouth daily.  . pravastatin (PRAVACHOL) 40 MG tablet TAKE 1 TABLET BY MOUTH DAILY  . sertraline (ZOLOFT) 50 MG tablet TAKE 1 TABLET(50 MG) BY MOUTH DAILY  . sildenafil (VIAGRA) 25 MG tablet TAKE 1 TO 2 TABLETS BY MOUTH AS NEEDED FOR SEXUAL ACTIVITY    Review of Systems  Constitutional: Negative for chills, fatigue and fever.  Respiratory: Negative for cough, chest tightness, shortness of breath and wheezing.   Cardiovascular: Negative for chest pain, palpitations and leg swelling.  Musculoskeletal:       And pain.  See HPI.    Objective:  BP 130/80 (BP Location: Left Arm, Patient Position: Sitting, Cuff Size: Large)   Pulse 89   Temp 98.1 F (36.7 C) (Temporal)   Ht 5\' 10"  (1.778 m)   Wt 241  lb 1.6 oz (109.4 kg)   SpO2 96%   BMI 34.59 kg/m   Weight: 241 lb 1.6 oz (109.4 kg)   BP Readings from Last 3 Encounters:  08/16/19 130/80  07/15/19 128/80  06/07/19 120/82   Wt Readings from Last 3 Encounters:  08/16/19 241 lb 1.6 oz (109.4 kg)  07/15/19 242 lb 12.8 oz (110.1 kg)  06/07/19 242 lb 9.6 oz (110 kg)    Physical Exam Constitutional:      General: He is not in acute distress.    Appearance: He is well-developed.  HENT:     Right Ear: Ear canal and external ear normal. Tympanic membrane is erythematous.     Left Ear: Tympanic membrane, ear canal and external ear normal.     Ears:     Comments: Purulent drainage covering ET right ear. TM very dull; opaque Cardiovascular:     Rate and Rhythm: Normal rate and regular rhythm.     Heart sounds: Normal heart sounds. No murmur. No friction rub.  Pulmonary:     Effort: Pulmonary effort is normal. No respiratory distress.     Breath sounds: Normal breath sounds. No wheezing or rales.  Musculoskeletal:     Right lower leg: No edema.     Left lower leg: No edema.  Skin:    Comments: Tender palmar nodule associated  with the fourth flexor tendon.  Neurological:     Mental Status: He is alert and oriented to person, place, and time.  Psychiatric:        Behavior: Behavior normal.     Assessment/Plan  1. Acute otitis media, unspecified otitis media type Has chronic issues with ear infections and responds well to Ciprodex usually given through ENT.  I have sent this in for him today. - ciprofloxacin-dexamethasone (CIPRODEX) OTIC suspension; Place 4 drops into the right ear 2 (two) times daily.  Dispense: 7.5 mL; Refill: 0  2. Trigger ring finger, unspecified laterality He does not wish to seek specialty evaluation for this.  He is going to work on icing of the hand and stretching but will let me know if he wants to see hand specialist, especially if symptoms are worsening.  3. Essential hypertension Blood pressure  still little bit elevated on morning for him.  We are going to change up his medication regimen to take amlodipine at bedtime and hydrochlorothiazide in the morning.  He will continue to monitor and update me in a couple weeks.   Return for mychart bp update 2 weeks.     Theodis Shove, MD

## 2019-08-16 NOTE — Patient Instructions (Signed)
Update me on blood pressures and hand and ear in 2 weeks time.

## 2019-08-18 DIAGNOSIS — I10 Essential (primary) hypertension: Secondary | ICD-10-CM | POA: Insufficient documentation

## 2019-08-20 ENCOUNTER — Other Ambulatory Visit: Payer: Self-pay | Admitting: Family Medicine

## 2019-08-29 ENCOUNTER — Telehealth: Payer: Self-pay | Admitting: Pulmonary Disease

## 2019-08-29 DIAGNOSIS — G4733 Obstructive sleep apnea (adult) (pediatric): Secondary | ICD-10-CM

## 2019-08-29 NOTE — Telephone Encounter (Signed)
DME referral for auto CPAP set up  Severe obstructive sleep apnea  Auto CPAP 5-20 Patient's mask of choice  Please send message to [facility] or let Almyra Free know that this is done-[facility] [employer] practitioner

## 2019-08-30 NOTE — Telephone Encounter (Signed)
Staff message received  from [facility] [facility] requesting cpap auto settings order be placed.  DME order placed.  Almyra Free is aware.  Nothing further at this time.

## 2019-08-31 ENCOUNTER — Other Ambulatory Visit (HOSPITAL_COMMUNITY): Payer: Managed Care, Other (non HMO)

## 2019-09-03 ENCOUNTER — Ambulatory Visit (HOSPITAL_BASED_OUTPATIENT_CLINIC_OR_DEPARTMENT_OTHER): Payer: Managed Care, Other (non HMO) | Admitting: Pulmonary Disease

## 2019-09-07 ENCOUNTER — Encounter: Payer: Self-pay | Admitting: Family Medicine

## 2019-10-20 ENCOUNTER — Other Ambulatory Visit: Payer: Self-pay | Admitting: Family Medicine

## 2019-10-26 ENCOUNTER — Other Ambulatory Visit: Payer: Self-pay

## 2019-10-26 ENCOUNTER — Emergency Department (HOSPITAL_COMMUNITY): Payer: Managed Care, Other (non HMO)

## 2019-10-26 ENCOUNTER — Encounter (HOSPITAL_COMMUNITY): Payer: Self-pay | Admitting: Emergency Medicine

## 2019-10-26 ENCOUNTER — Emergency Department (HOSPITAL_COMMUNITY)
Admission: EM | Admit: 2019-10-26 | Discharge: 2019-10-26 | Disposition: A | Discharge status: Home / Self Care | Payer: Managed Care, Other (non HMO) | Attending: Emergency Medicine | Admitting: Emergency Medicine

## 2019-10-26 DIAGNOSIS — Z87891 Personal history of nicotine dependence: Secondary | ICD-10-CM | POA: Diagnosis not present

## 2019-10-26 DIAGNOSIS — R05 Cough: Secondary | ICD-10-CM | POA: Insufficient documentation

## 2019-10-26 DIAGNOSIS — M546 Pain in thoracic spine: Secondary | ICD-10-CM | POA: Diagnosis present

## 2019-10-26 DIAGNOSIS — M62830 Muscle spasm of back: Secondary | ICD-10-CM | POA: Diagnosis not present

## 2019-10-26 DIAGNOSIS — Z79899 Other long term (current) drug therapy: Secondary | ICD-10-CM | POA: Diagnosis not present

## 2019-10-26 DIAGNOSIS — I10 Essential (primary) hypertension: Secondary | ICD-10-CM | POA: Diagnosis not present

## 2019-10-26 LAB — CBC WITH DIFFERENTIAL/PLATELET
Abs Immature Granulocytes: 0.02 10*3/uL (ref 0.00–0.07)
Basophils Absolute: 0 10*3/uL (ref 0.0–0.1)
Basophils Relative: 0 %
Eosinophils Absolute: 0.2 10*3/uL (ref 0.0–0.5)
Eosinophils Relative: 2 %
HCT: 38.4 % — ABNORMAL LOW (ref 39.0–52.0)
Hemoglobin: 12.7 g/dL — ABNORMAL LOW (ref 13.0–17.0)
Immature Granulocytes: 0 %
Lymphocytes Relative: 18 %
Lymphs Abs: 1.5 10*3/uL (ref 0.7–4.0)
MCH: 29.5 pg (ref 26.0–34.0)
MCHC: 33.1 g/dL (ref 30.0–36.0)
MCV: 89.1 fL (ref 80.0–100.0)
Monocytes Absolute: 0.9 10*3/uL (ref 0.1–1.0)
Monocytes Relative: 11 %
Neutro Abs: 5.6 10*3/uL (ref 1.7–7.7)
Neutrophils Relative %: 69 %
Platelets: 234 10*3/uL (ref 150–400)
RBC: 4.31 MIL/uL (ref 4.22–5.81)
RDW: 12.8 % (ref 11.5–15.5)
WBC: 8.2 10*3/uL (ref 4.0–10.5)
nRBC: 0 % (ref 0.0–0.2)

## 2019-10-26 LAB — BASIC METABOLIC PANEL
Anion gap: 11 (ref 5–15)
BUN: 15 mg/dL (ref 6–20)
CO2: 24 mmol/L (ref 22–32)
Calcium: 8.9 mg/dL (ref 8.9–10.3)
Chloride: 101 mmol/L (ref 98–111)
Creatinine, Ser: 0.97 mg/dL (ref 0.61–1.24)
GFR calc Af Amer: >60 mL/min (ref >60)
GFR calc non Af Amer: >60 mL/min (ref >60)
Glucose, Bld: 94 mg/dL (ref 70–99)
Potassium: 3.6 mmol/L (ref 3.5–5.1)
Sodium: 136 mmol/L (ref 135–145)

## 2019-10-26 LAB — D-DIMER, QUANTITATIVE: D-Dimer, Quant: <0.27 ug/mL-FEU (ref 0.00–0.50)

## 2019-10-26 MED ORDER — METHOCARBAMOL 500 MG PO TABS
500.0000 mg | ORAL_TABLET | Freq: Two times a day (BID) | ORAL | 0 refills | Status: DC | PRN
Start: 2019-10-26 — End: 2020-07-24

## 2019-10-26 MED ORDER — KETOROLAC TROMETHAMINE 30 MG/ML IJ SOLN
30.0000 mg | Freq: Once | INTRAMUSCULAR | Status: AC
  Administered 2019-10-26: 30 mg via INTRAMUSCULAR
  Filled 2019-10-26: qty 1

## 2019-10-26 NOTE — ED Provider Notes (Signed)
[place] [facility] EMERGENCY DEPARTMENT Provider Note   CSN: [id number] Arrival date & time: 10/26/19  1350     History Chief Complaint  Patient presents with  . Back Pain    Timothy Jennings is a 54 y.o. male.  HPI   54 year old male with a history of allergies, chickenpox, hyperlipidemia, osteoarthritis, otitis media, trigger finger, hypertension, who presents to the emergency department today for evaluation of right upper back pain.  States he has had pain to the right upper back for the last 4 days.  Initially pain was intermittent however today it has become constant.  Pain rated 4/10 however it worsens with movement of the upper extremities, breathing, coughing, laughing and during these times he rates it 8-9/10.  He also reports a cough.  He denies any chest pain or shortness of breath.  He denies any abdominal pain, nausea, vomiting, diarrhea.  He is complaining of a cough.   Denies leg pain/swelling, hemoptysis, recent surgery/trauma, recent long travel, hormone use, personal hx of cancer, or hx of DVT/PE.   Past Medical History:  Diagnosis Date  . Allergy   . Chicken pox   . Hyperlipidemia 02/21/2012  . 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 Otitis media    followed by ENT   . Trigger finger, acquired 07/01/2015   Involves 5th MCP on RT with nodule noted on [place]  Thumb may rarely trigger     Patient Active Problem List   Diagnosis Date Noted  . Hypertension 08/18/2019  . Closed fracture of lateral portion of right tibial plateau 12/08/2017  . BMI 32.0-32.9,adult 08/08/2016  . Hyperglycemia 08/08/2016  . Seasonal allergies 01/30/2015  . Hyperlipidemia 02/21/2012    Past Surgical History:  Procedure Laterality Date  . BILATERAL CARPAL TUNNEL RELEASE    . EYE SURGERY     in childhood to correct muscle imbalance  . HEMORROIDECTOMY    . NECK SURGERY  2007   bone spur C5-6  . ORIF TIBIA PLATEAU Right  12/08/2017   Procedure: OPEN REDUCTION INTERNAL FIXATION (ORIF) LATERAL TIBIAL PLATEAU;  Surgeon: Nicholes Stairs, MD;  Location: [facility];  Service: Orthopedics;  Laterality: Right;  . TYMPANOSTOMY TUBE PLACEMENT    . VASECTOMY         Family History  Problem Relation Age of Onset  . Colon cancer Mother 61       dx at 81  . High blood pressure Mother   . Hyperlipidemia Father   . High blood pressure Father   . Healthy Maternal Grandmother   . Healthy Maternal Grandfather        COVID  . Hypertension Paternal Grandmother   . Heart disease Paternal Grandmother   . Hypertension Paternal Grandfather   . Heart disease Paternal Grandfather   . Alcohol abuse Other        Grandparent  . Colon polyps Neg Hx   . Esophageal cancer Neg Hx   . Rectal cancer Neg Hx   . Stomach cancer Neg Hx     Social History   Tobacco Use  . Smoking status: Former Smoker    Packs/day: 2.00    Years: 20.00    Pack years: 40.00    Types: E-cigarettes, Cigarettes  . Smokeless tobacco: Never Used  Vaping Use  . Vaping Use: Never used  Substance Use Topics  . Alcohol use: Yes    Alcohol/week: 14.0 standard drinks  Types: 14 Cans of beer per week    Comment: 3-4 per week  . Drug use: No    Home Medications Prior to Admission medications   Medication Sig Start Date End Date Taking? Authorizing Provider  amLODipine (NORVASC) 10 MG tablet Take 1 tablet (10 mg total) by mouth daily. 06/07/19   Wynn Banker, MD  cetirizine (ZYRTEC) 10 MG tablet Take 1 tablet (10 mg total) by mouth daily. 06/07/19   Wynn Banker, MD  ciprofloxacin-dexamethasone (CIPRODEX) OTIC suspension Place 4 drops into the right ear 2 (two) times daily. 08/16/19   Wynn Banker, MD  Fenofibrate 50 MG CAPS Take 1 capsule by mouth once daily 10/21/19   Koberlein, Paris Lore, MD  fluticasone (FLONASE) 50 MCG/ACT nasal spray Place 2 sprays into both nostrils daily. 06/07/19   Koberlein, Paris Lore, MD  hydrochlorothiazide  (HYDRODIURIL) 12.5 MG tablet Take 1 tablet (12.5 mg total) by mouth daily. 04/02/19   Terressa Koyanagi, DO  methocarbamol (ROBAXIN) 500 MG tablet Take 1 tablet (500 mg total) by mouth every 12 (twelve) hours as needed for muscle spasms. Do not drive, work, operate heavy machinery, or take care of children while taking this medication as it can be sedating. 10/26/19   Isha Seefeld S, PA-C  pravastatin (PRAVACHOL) 40 MG tablet TAKE 1 TABLET BY MOUTH DAILY 03/08/19   Koberlein, Junell C, MD  sertraline (ZOLOFT) 50 MG tablet TAKE 1 TABLET(50 MG) BY MOUTH DAILY 08/20/19   Koberlein, Jannette Spanner C, MD  sildenafil (VIAGRA) 25 MG tablet TAKE 1 TO 2 TABLETS BY MOUTH AS NEEDED FOR SEXUAL ACTIVITY 04/23/19   Wynn Banker, MD    Allergies    Other  Review of Systems   Review of Systems  Constitutional: Negative for fever.  HENT: Negative for ear pain and sore throat.   Eyes: Negative for visual disturbance.  Respiratory: Positive for cough. Negative for shortness of breath.   Cardiovascular: Negative for chest pain.  Gastrointestinal: Negative for abdominal pain, constipation, diarrhea, nausea and vomiting.  Genitourinary: Negative for dysuria and hematuria.  Musculoskeletal: Positive for back pain.  Skin: Negative for rash.  Neurological: Negative for headaches.  All other systems reviewed and are negative.   Physical Exam Updated Vital Signs BP (!) 137/92 (BP Location: Right Arm)   Pulse 73   Temp 97.8 F (36.6 C) (Oral)   Resp 16   SpO2 97%   Physical Exam Vitals and nursing note reviewed.  Constitutional:      Appearance: He is well-developed.  HENT:     Head: Normocephalic and atraumatic.  Eyes:     Conjunctiva/sclera: Conjunctivae normal.  Cardiovascular:     Rate and Rhythm: Normal rate and regular rhythm.     Heart sounds: Normal heart sounds. No murmur heard.   Pulmonary:     Effort: Pulmonary effort is normal. No respiratory distress.     Breath sounds: Normal breath  sounds. No wheezing, rhonchi or rales.  Abdominal:     General: Bowel sounds are normal.     Palpations: Abdomen is soft.     Tenderness: There is no abdominal tenderness. There is no guarding or rebound.  Musculoskeletal:     Cervical back: Neck supple.     Comments: No TTP to the cervical, thoracic, or lumbar spine. No TTP to the paraspinous muscles and no reproducible back pain.   Skin:    General: Skin is warm and dry.  Neurological:     Mental Status:  He is alert.     ED Results / Procedures / Treatments   Labs (all labs ordered are listed, but only abnormal results are displayed) Labs Reviewed  CBC WITH DIFFERENTIAL/PLATELET - Abnormal; Notable for the following components:      Result Value   Hemoglobin 12.7 (*)    HCT 38.4 (*)    All other components within normal limits  BASIC METABOLIC PANEL  D-DIMER, QUANTITATIVE (NOT AT [facility])    EKG EKG Interpretation  Date/Time:  Saturday October 26 2019 14:12:29 EDT Ventricular Rate:  88 PR Interval:  146 QRS Duration: 86 QT Interval:  372 QTC Calculation: 450 R Axis:   45 Text Interpretation: Normal sinus rhythm Normal ECG When comapred to priorl no significant cahnges seen. No STEMI Confirmed by Theda Belfast ([id number]) on 10/26/2019 4:40:05 PM   Radiology DG Chest 2 View  Result Date: 10/26/2019 CLINICAL DATA:  Upper back pain. EXAM: CHEST - 2 VIEW COMPARISON:  03/03/2013 FINDINGS: The heart size and mediastinal contours are within normal limits. Both lungs are clear. The visualized skeletal structures are unremarkable. IMPRESSION: No active cardiopulmonary disease. Electronically Signed   By: Katherine Mantle M.D.   On: 10/26/2019 17:30    Procedures Procedures (including critical care time)  Medications Ordered in ED Medications  ketorolac (TORADOL) 30 MG/ML injection 30 mg (30 mg Intramuscular Given 10/26/19 1719)    ED Course  I have reviewed the triage vital signs and the nursing notes.  Pertinent labs &  imaging results that were available during my care of the patient were reviewed by me and considered in my medical decision making (see chart for details).    MDM Rules/Calculators/A&P                          54 year old male presenting for evaluation of right upper back pain that started 4 days ago.  It is not reproducible on exam.  It is pleuritic in nature and is also worse with movement.  Vital signs are reassuring  EKG with normal sinus rhythm, no ischemic changes or changes from prior EKG.  We will get labs, D-dimer, chest x-ray and give Toradol.  CBC nonacute BMP nonacute Ddimer neg - this makes PE much less likely  CXR negative for pneumonia, pneumothorax or other emergent abnormality.  No obvious rib fractures.  Reassessed patient, he states he feels somewhat improved after Toradol.  He is in no acute distress.  His work-up here is reassuring, his symptoms seem to be related to MSK cause given it is present mostly with movements in certain positions, doubt ACS, or other emergent cardiac/pulmonary cause.  Will give course of muscle relaxers and advised him to continue anti-inflammatories at home.  Advised PCP follow-up and strict return precautions.  He voiced understanding of the plan and reasons to return.  All questions answered.  Patient stable for discharge.  Final Clinical Impression(s) / ED Diagnoses Final diagnoses:  Muscle spasm of back    Rx / DC Orders ED Discharge Orders         Ordered    methocarbamol (ROBAXIN) 500 MG tablet  Every 12 hours PRN     Discontinue  Reprint     10/26/19 1855           Karrie Meres, PA-C 10/26/19 1855    Tegeler, Canary Brim, MD 10/27/19 [phone]

## 2019-10-26 NOTE — Discharge Instructions (Addendum)

## 2019-10-26 NOTE — ED Notes (Signed)
Patient transported to x-ray. ?

## 2019-10-26 NOTE — ED Triage Notes (Signed)
C/o intermittent R sided back pain under shoulder blade since Wednesday.  States pain was initially worse with movement, deep breathing, coughing, sneezing, and laughing.  Pain is now constant.  Took Ibuprofen and used lidocaine patch without relief.

## 2019-11-10 ENCOUNTER — Encounter: Payer: Self-pay | Admitting: Family Medicine

## 2019-11-11 MED ORDER — SERTRALINE HCL 50 MG PO TABS: 75.0000 mg | ORAL_TABLET | Freq: Every day | ORAL | 1 refills | Status: DC

## 2019-11-11 MED ORDER — HYDROCHLOROTHIAZIDE 25 MG PO TABS
25.0000 mg | ORAL_TABLET | Freq: Every day | ORAL | 1 refills | Status: DC
Start: 2019-11-11 — End: 2020-02-25

## 2019-12-16 ENCOUNTER — Other Ambulatory Visit: Payer: Self-pay | Admitting: *Deleted

## 2019-12-16 MED ORDER — AMLODIPINE BESYLATE 10 MG PO TABS: 10.0000 mg | ORAL_TABLET | Freq: Every day | ORAL | 1 refills | Status: DC

## 2019-12-16 NOTE — Telephone Encounter (Signed)
Rx done. 

## 2019-12-18 ENCOUNTER — Other Ambulatory Visit: Payer: Self-pay | Admitting: *Deleted

## 2019-12-18 MED ORDER — SILDENAFIL CITRATE 25 MG PO TABS: ORAL_TABLET | ORAL | 0 refills | Status: DC

## 2019-12-18 NOTE — Telephone Encounter (Signed)
Rx done. 

## 2019-12-24 ENCOUNTER — Other Ambulatory Visit: Payer: Self-pay | Admitting: *Deleted

## 2019-12-24 MED ORDER — SILDENAFIL CITRATE 25 MG PO TABS: ORAL_TABLET | ORAL | 1 refills | Status: DC

## 2019-12-24 NOTE — Telephone Encounter (Signed)
Rx done. 

## 2020-01-31 ENCOUNTER — Other Ambulatory Visit: Payer: Self-pay | Admitting: Family Medicine

## 2020-02-17 ENCOUNTER — Other Ambulatory Visit: Payer: Self-pay | Admitting: Family Medicine

## 2020-02-25 ENCOUNTER — Other Ambulatory Visit: Payer: Self-pay | Admitting: *Deleted

## 2020-02-25 MED ORDER — SERTRALINE HCL 50 MG PO TABS: 50.0000 mg | ORAL_TABLET | Freq: Every day | ORAL | 0 refills | Status: DC

## 2020-02-25 MED ORDER — FENOFIBRATE 50 MG PO CAPS: 1.0000 | ORAL_CAPSULE | Freq: Every day | ORAL | 0 refills | Status: DC

## 2020-02-25 MED ORDER — PRAVASTATIN SODIUM 40 MG PO TABS: 40.0000 mg | ORAL_TABLET | Freq: Every day | ORAL | 0 refills | Status: DC

## 2020-02-25 MED ORDER — AMLODIPINE BESYLATE 10 MG PO TABS: 10.0000 mg | ORAL_TABLET | Freq: Every day | ORAL | 0 refills | Status: DC

## 2020-02-25 MED ORDER — HYDROCHLOROTHIAZIDE 25 MG PO TABS: 25.0000 mg | ORAL_TABLET | Freq: Every day | ORAL | 0 refills | Status: DC

## 2020-02-25 NOTE — Telephone Encounter (Signed)
Rx done. 

## 2020-04-16 ENCOUNTER — Other Ambulatory Visit: Payer: Self-pay | Admitting: Family Medicine

## 2020-05-21 ENCOUNTER — Other Ambulatory Visit: Payer: Self-pay | Admitting: Family Medicine

## 2020-07-23 ENCOUNTER — Other Ambulatory Visit: Payer: Self-pay

## 2020-07-24 ENCOUNTER — Encounter: Payer: Self-pay | Admitting: Family Medicine

## 2020-07-24 ENCOUNTER — Ambulatory Visit: Payer: Managed Care, Other (non HMO) | Admitting: Family Medicine

## 2020-07-24 ENCOUNTER — Ambulatory Visit (INDEPENDENT_AMBULATORY_CARE_PROVIDER_SITE_OTHER): Payer: Managed Care, Other (non HMO)

## 2020-07-24 VITALS — BP 128/90 | HR 81 | Temp 98.1°F | Ht 70.0 in | Wt 243.7 lb

## 2020-07-24 DIAGNOSIS — I10 Essential (primary) hypertension: Secondary | ICD-10-CM | POA: Diagnosis not present

## 2020-07-24 DIAGNOSIS — E785 Hyperlipidemia, unspecified: Secondary | ICD-10-CM

## 2020-07-24 DIAGNOSIS — Z0000 Encounter for general adult medical examination without abnormal findings: Secondary | ICD-10-CM | POA: Diagnosis not present

## 2020-07-24 DIAGNOSIS — D649 Anemia, unspecified: Secondary | ICD-10-CM

## 2020-07-24 DIAGNOSIS — F321 Major depressive disorder, single episode, moderate: Secondary | ICD-10-CM

## 2020-07-24 DIAGNOSIS — G4730 Sleep apnea, unspecified: Secondary | ICD-10-CM

## 2020-07-24 DIAGNOSIS — R61 Generalized hyperhidrosis: Secondary | ICD-10-CM | POA: Diagnosis not present

## 2020-07-24 DIAGNOSIS — Z23 Encounter for immunization: Secondary | ICD-10-CM | POA: Diagnosis not present

## 2020-07-24 DIAGNOSIS — R739 Hyperglycemia, unspecified: Secondary | ICD-10-CM | POA: Diagnosis not present

## 2020-07-24 LAB — COMPREHENSIVE METABOLIC PANEL
ALT: 24 U/L (ref 0–53)
AST: 22 U/L (ref 0–37)
Albumin: 4.8 g/dL (ref 3.5–5.2)
Alkaline Phosphatase: 86 U/L (ref 39–117)
BUN: 17 mg/dL (ref 6–23)
CO2: 27 mEq/L (ref 19–32)
Calcium: 9.4 mg/dL (ref 8.4–10.5)
Chloride: 102 mEq/L (ref 96–112)
Creatinine, Ser: 0.9 mg/dL (ref 0.40–1.50)
GFR: 97.78 mL/min (ref >60.00)
Glucose, Bld: 101 mg/dL — ABNORMAL HIGH (ref 70–99)
Potassium: 4 mEq/L (ref 3.5–5.1)
Sodium: 139 mEq/L (ref 135–145)
Total Bilirubin: 0.4 mg/dL (ref 0.2–1.2)
Total Protein: 7.7 g/dL (ref 6.0–8.3)

## 2020-07-24 LAB — CBC WITH DIFFERENTIAL/PLATELET
Basophils Absolute: 0.1 10*3/uL (ref 0.0–0.1)
Basophils Relative: 0.8 % (ref 0.0–3.0)
Eosinophils Absolute: 0.1 10*3/uL (ref 0.0–0.7)
Eosinophils Relative: 1.9 % (ref 0.0–5.0)
HCT: 42.6 % (ref 39.0–52.0)
Hemoglobin: 14.5 g/dL (ref 13.0–17.0)
Lymphocytes Relative: 19 % (ref 12.0–46.0)
Lymphs Abs: 1.4 10*3/uL (ref 0.7–4.0)
MCHC: 34.1 g/dL (ref 30.0–36.0)
MCV: 89.6 fl (ref 78.0–100.0)
Monocytes Absolute: 0.7 10*3/uL (ref 0.1–1.0)
Monocytes Relative: 9 % (ref 3.0–12.0)
Neutro Abs: 5 10*3/uL (ref 1.4–7.7)
Neutrophils Relative %: 69.3 % (ref 43.0–77.0)
Platelets: 202 10*3/uL (ref 150.0–400.0)
RBC: 4.75 Mil/uL (ref 4.22–5.81)
RDW: 13.7 % (ref 11.5–15.5)
WBC: 7.2 10*3/uL (ref 4.0–10.5)

## 2020-07-24 LAB — LDL CHOLESTEROL, DIRECT: Direct LDL: 118 mg/dL

## 2020-07-24 LAB — LIPID PANEL
Cholesterol: 201 mg/dL — ABNORMAL HIGH (ref 0–200)
HDL: 38.8 mg/dL — ABNORMAL LOW (ref >39.00)
NonHDL: 162.1
Total CHOL/HDL Ratio: 5
Triglycerides: 290 mg/dL — ABNORMAL HIGH (ref 0.0–149.0)
VLDL: 58 mg/dL — ABNORMAL HIGH (ref 0.0–40.0)

## 2020-07-24 LAB — TSH: TSH: 2.13 u[IU]/mL (ref 0.35–4.50)

## 2020-07-24 LAB — VITAMIN B12: Vitamin B-12: 262 pg/mL (ref 211–911)

## 2020-07-24 LAB — HEMOGLOBIN A1C: Hgb A1c MFr Bld: 6 % (ref 4.6–6.5)

## 2020-07-24 LAB — FERRITIN: Ferritin: 95.5 ng/mL (ref 22.0–322.0)

## 2020-07-24 LAB — T4, FREE: Free T4: 0.7 ng/dL (ref 0.60–1.60)

## 2020-07-24 MED ORDER — SERTRALINE HCL 100 MG PO TABS: ORAL_TABLET | ORAL | 1 refills | Status: DC

## 2020-07-24 MED ORDER — AMOXICILLIN-POT CLAVULANATE 875-125 MG PO TABS: 1.0000 | ORAL_TABLET | Freq: Two times a day (BID) | ORAL | 0 refills | Status: DC

## 2020-07-24 NOTE — Progress Notes (Signed)
Timothy Jennings DOB: 06-23-1965 Encounter date: 07/24/2020  This is a 55 y.o. male who presents for complete physical   History of present illness/Additional concerns:  Sleep apnea: never got CPAP machine; states that he felt rushed with doc he saw. He is wondering about seeing diff provider. Magda Paganini. Having night sweats - chest up. Soaking wet. Happening about 6 months now. Feels like it got worse after having covid. Just at night; not noting during day. No other sick symptoms like cough, congestion. Does have typical allergies sx. Takes zyrtec and flonase regularly.   Thinks he has sinus/ear infection. Has been going on for a couple of weeks. Still with a little dizziness. When he has ear/sinus infection gets blood out of right ear. He had left over cipro which he took (this is what ENT usually gives) - doing 2 drops BID. No fevers. Maxillary sinus pain. Occasionally will take alka seltzer sinus.   Hypertension: Amlodipine 10 mg, hydrochlorothiazide 25 mg  Hyperlipidemia: Fenofibrate 50 mg, pravastatin 40 mg Depression: Zoloft 50 mg. Anxiety is getting a little worse. Wife having hard time; affecting him. Also new position at work, more responsibility. Waking up 3-5 times/night.   Past Medical History:  Diagnosis Date  . Allergy   . Chicken pox   . Hyperlipidemia 02/21/2012  . 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 Otitis media    followed by ENT   . Trigger finger, acquired 07/01/2015   Involves 5th MCP on RT with nodule noted on [place]  Thumb may rarely trigger    Past Surgical History:  Procedure Laterality Date  . BILATERAL CARPAL TUNNEL RELEASE    . EYE SURGERY     in childhood to correct muscle imbalance  . HEMORROIDECTOMY    . NECK SURGERY  2007   bone spur C5-6  . ORIF TIBIA PLATEAU Right 12/08/2017   Procedure: OPEN REDUCTION INTERNAL FIXATION (ORIF) LATERAL TIBIAL PLATEAU;  Surgeon: Yolonda Kida, MD;  Location: [facility];  Service: Orthopedics;  Laterality: Right;  . TYMPANOSTOMY TUBE PLACEMENT    . VASECTOMY     Allergies  Allergen Reactions  . Other Hives and Swelling    Tree nuts   No outpatient medications have been marked as taking for the 07/24/20 encounter (Office Visit) with Wynn Banker, MD.   Social History   Tobacco Use  . Smoking status: Former Smoker    Packs/day: 2.00    Years: 20.00    Pack years: 40.00    Types: E-cigarettes, Cigarettes  . Smokeless tobacco: Never Used  Substance Use Topics  . Alcohol use: Yes    Alcohol/week: 14.0 standard drinks    Types: 14 Cans of beer per week    Comment: 3-4 per week   Family History  Problem Relation Age of Onset  . Colon cancer Mother 35       dx at 63  . High blood pressure Mother   . Hyperlipidemia Father   . High blood pressure Father   . Healthy Maternal Grandmother   . Healthy Maternal Grandfather        COVID  . Hypertension Paternal Grandmother   . Heart disease Paternal Grandmother   . Hypertension Paternal Grandfather   . Heart disease Paternal Grandfather   . Alcohol abuse Other        Grandparent  . Colon polyps Neg Hx   . Esophageal cancer Neg Hx   .  Rectal cancer Neg Hx   . Stomach cancer Neg Hx      Review of Systems  Constitutional: Negative for activity change, appetite change, chills, fatigue, fever and unexpected weight change.  HENT: Positive for ear discharge, ear pain, sinus pressure and sinus pain. Negative for congestion, hearing loss, sore throat and trouble swallowing.   Eyes: Negative for pain and visual disturbance.  Respiratory: Positive for cough (intermittent, hacking). Negative for chest tightness, shortness of breath (only rarely with exertion, not reproducible) and wheezing.   Cardiovascular: Negative for chest pain, palpitations and leg swelling.  Gastrointestinal: Negative for abdominal distention, abdominal pain, blood in stool, constipation, diarrhea, nausea and vomiting.   Genitourinary: Negative for decreased urine volume, difficulty urinating, dysuria, penile pain and testicular pain.  Musculoskeletal: Negative for arthralgias, back pain and joint swelling.  Skin: Negative for rash.  Neurological: Negative for dizziness, weakness, numbness and headaches.  Hematological: Negative for adenopathy. Does not bruise/bleed easily.  Psychiatric/Behavioral: Negative for agitation, sleep disturbance and suicidal ideas. The patient is not nervous/anxious.     CBC:  Lab Results  Component Value Date   WBC 8.2 10/26/2019   HGB 12.7 (L) 10/26/2019   HCT 38.4 (L) 10/26/2019   MCH 29.5 10/26/2019   MCHC 33.1 10/26/2019   RDW 12.8 10/26/2019   PLT 234 10/26/2019   MPV 9.6 03/03/2013   CMP: Lab Results  Component Value Date   NA 136 10/26/2019   K 3.6 10/26/2019   CL 101 10/26/2019   CO2 24 10/26/2019   ANIONGAP 11 10/26/2019   GLUCOSE 94 10/26/2019   BUN 15 10/26/2019   CREATININE 0.97 10/26/2019   GFRAA >60 10/26/2019   CALCIUM 8.9 10/26/2019   PROT 7.1 08/09/2019   BILITOT 0.5 08/09/2019   ALKPHOS 80 08/09/2019   ALT 28 08/09/2019   AST 26 08/09/2019   LIPID: Lab Results  Component Value Date   CHOL 207 (H) 05/29/2019   TRIG 274.0 (H) 05/29/2019   HDL 36.60 (L) 05/29/2019   LDLCALC 134 (H) 05/31/2018    Objective:  BP 128/90 (BP Location: Left Arm, Patient Position: Sitting, Cuff Size: Large)   Pulse 81   Temp 98.1 F (36.7 C) (Oral)   Ht 5\' 10"  (1.778 m)   Wt 243 lb 11.2 oz (110.5 kg)   SpO2 93%   BMI 34.97 kg/m   Weight: 243 lb 11.2 oz (110.5 kg)   BP Readings from Last 3 Encounters:  07/24/20 128/90  10/26/19 (!) 137/92  08/16/19 130/80   Wt Readings from Last 3 Encounters:  07/24/20 243 lb 11.2 oz (110.5 kg)  08/16/19 241 lb 1.6 oz (109.4 kg)  07/15/19 242 lb 12.8 oz (110.1 kg)    Physical Exam Constitutional:      General: He is not in acute distress.    Appearance: He is well-developed.  HENT:     Head:  Normocephalic and atraumatic.     Right Ear: External ear normal.     Left Ear: External ear normal.     Nose: Nose normal.     Mouth/Throat:     Pharynx: No oropharyngeal exudate.  Eyes:     Conjunctiva/sclera: Conjunctivae normal.     Pupils: Pupils are equal, round, and reactive to light.  Neck:     Thyroid: No thyromegaly.  Cardiovascular:     Rate and Rhythm: Normal rate and regular rhythm.     Heart sounds: Normal heart sounds. No murmur heard. No friction rub. No gallop.  Pulmonary:     Effort: Pulmonary effort is normal. No respiratory distress.     Breath sounds: Normal breath sounds. No stridor. No wheezing or rales.     Comments: Rales bilat bases; R>L Abdominal:     General: Bowel sounds are normal.     Palpations: Abdomen is soft.  Musculoskeletal:        General: Normal range of motion.     Cervical back: Neck supple.  Skin:    General: Skin is warm and dry.     Comments: Sun damage head; generally good about wearing hat  Neurological:     Mental Status: He is alert and oriented to person, place, and time.  Psychiatric:        Behavior: Behavior normal.        Thought Content: Thought content normal.        Judgment: Judgment normal.     Assessment/Plan: Health Maintenance Due  Topic Date Due  . Hepatitis C Screening  Never done  . INFLUENZA VACCINE  12/01/2019  . COVID-19 Vaccine (3 - Booster for Pfizer series) 03/02/2020   Health Maintenance reviewed.  1. Preventative health care He is working on healthy eating plan through work. Has been successful with this in past - about portion control/accountability. shingrix given today. - Hepatitis C antibody; Future  2. Primary hypertension Elevated today; check at home; return in 1 mo for recheck. Losing weight will be helpful for this. Let me know if elevated at home. Also he has sinus infection today; which likely isn't helping. Will check in once we get bloodwork.  - CBC with Differential/Platelet;  Future - Comprehensive metabolic panel; Future  3. Hyperlipidemia, unspecified hyperlipidemia type Continue with pravastatin 40mg , fenofibrate 50mg ; recheck labs. - Lipid panel; Future  4. Hyperglycemia Recheck bloodwork. - Hemoglobin A1c; Future  5. Sleep apnea, unspecified type New referral; control of sleep apnea key to being healthier, bp control. - Ambulatory referral to Sleep Studies  6. Depression, major, single episode, moderate (HCC) Increase zoloft to 100mg ; will f/u in 1 mo time.  7. Night sweats Start with bloodwork, cxr. Follow up pending this. May be related to ongoing sinus infection as well. - TSH; Future - T4, free; Future - DG Chest 2 View; Future  8. Anemia, unspecified type - Vitamin B12; Future - Ferritin; Future   Return in about 1 month (around 08/24/2020) for Chronic condition visit - blood pressure recheck.  , MD

## 2020-07-27 LAB — HEPATITIS C ANTIBODY
Hepatitis C Ab: NONREACTIVE
SIGNAL TO CUT-OFF: 0.01 (ref <1.00)

## 2020-08-05 ENCOUNTER — Other Ambulatory Visit: Payer: Self-pay | Admitting: Family Medicine

## 2020-08-05 MED ORDER — FENOFIBRATE 145 MG PO TABS: 145.0000 mg | ORAL_TABLET | Freq: Every day | ORAL | 3 refills | Status: DC

## 2020-09-14 ENCOUNTER — Ambulatory Visit: Payer: Managed Care, Other (non HMO) | Admitting: Family Medicine

## 2020-09-14 DIAGNOSIS — Z0289 Encounter for other administrative examinations: Secondary | ICD-10-CM

## 2020-09-14 NOTE — Progress Notes (Unsigned)
No Show

## 2020-09-21 ENCOUNTER — Other Ambulatory Visit: Payer: Self-pay | Admitting: Family Medicine

## 2020-09-21 MED ORDER — PRAVASTATIN SODIUM 40 MG PO TABS: 40.0000 mg | ORAL_TABLET | Freq: Every day | ORAL | 0 refills | Status: DC

## 2020-09-21 MED ORDER — AMLODIPINE BESYLATE 10 MG PO TABS: 10.0000 mg | ORAL_TABLET | Freq: Every day | ORAL | 0 refills | Status: DC

## 2020-09-25 ENCOUNTER — Encounter: Payer: Self-pay | Admitting: Family Medicine

## 2020-10-31 ENCOUNTER — Other Ambulatory Visit: Payer: Self-pay | Admitting: Family Medicine

## 2020-11-03 MED ORDER — HYDROCHLOROTHIAZIDE 25 MG PO TABS: 25.0000 mg | ORAL_TABLET | Freq: Every day | ORAL | 0 refills | Status: DC

## 2021-01-21 ENCOUNTER — Telehealth: Payer: BC Managed Care – PPO | Admitting: Physician Assistant

## 2021-01-21 DIAGNOSIS — J019 Acute sinusitis, unspecified: Secondary | ICD-10-CM

## 2021-01-21 DIAGNOSIS — B9689 Other specified bacterial agents as the cause of diseases classified elsewhere: Secondary | ICD-10-CM

## 2021-01-21 MED ORDER — AMOXICILLIN-POT CLAVULANATE 875-125 MG PO TABS: 1.0000 | ORAL_TABLET | Freq: Two times a day (BID) | ORAL | 0 refills | Status: DC

## 2021-01-21 NOTE — Progress Notes (Signed)
Virtual Visit Consent   Timothy Jennings, you are scheduled for a virtual visit with a [facility] provider today.     Just as with appointments in the office, your consent must be obtained to participate.  Your consent will be active for this visit and any virtual visit you may have with one of our providers in the next 365 days.     If you have a MyChart account, a copy of this consent can be sent to you electronically.  All virtual visits are billed to your insurance company just like a traditional visit in the office.    As this is a virtual visit, video technology does not allow for your provider to perform a traditional examination.  This may limit your provider's ability to fully assess your condition.  If your provider identifies any concerns that need to be evaluated in person or the need to arrange testing (such as labs, EKG, etc.), we will make arrangements to do so.     Although advances in technology are sophisticated, we cannot ensure that it will always work on either your end or our end.  If the connection with a video visit is poor, the visit may have to be switched to a telephone visit.  With either a video or telephone visit, we are not always able to ensure that we have a secure connection.     I need to obtain your verbal consent now.   Are you willing to proceed with your visit today?    Timothy Jennings has provided verbal consent on 01/21/2021 for a virtual visit (video or telephone).   Piedad Climes, [place]   Date: 01/21/2021 2:22 PM   Virtual Visit via Video Note   I, Piedad Climes, connected with  Timothy Jennings  ([id number], 12/06/1965) on 01/21/21 at  2:15 PM EDT by a video-enabled telemedicine application and verified that I am speaking with the correct person using two identifiers.  Location: Patient: Virtual Visit Location Patient: Home Provider: Virtual Visit Location Provider: Home Office   I discussed the limitations of evaluation and management  by telemedicine and the availability of in person appointments. The patient expressed understanding and agreed to proceed.    History of Present Illness: Timothy Jennings is a 55 y.o. who identifies as a male who was assigned male at birth, and is being seen today for R ear pain. Notes drainage from that ear over the past couple of weeks. Has a T-tube in place. Also with head congestion and sinus pain off and on for the past couple of weeks, now worse. Now notes that he woke up this morning with a borderline temperature of around 99.9 all day despite taking tylenol. Took took home COVID tests today -- one did not work and the second was negative.   HPI: HPI  Problems:  Patient Active Problem List   Diagnosis Date Noted   Hypertension 08/18/2019   Closed fracture of lateral portion of right tibial plateau 12/08/2017   BMI 32.0-32.9,adult 08/08/2016   Hyperglycemia 08/08/2016   Seasonal allergies 01/30/2015   Hyperlipidemia 02/21/2012    Allergies:  Allergies  Allergen Reactions   Other Hives and Swelling    Tree nuts   Medications:  Current Outpatient Medications:    amoxicillin-clavulanate (AUGMENTIN) 875-125 MG tablet, Take 1 tablet by mouth 2 (two) times daily., Disp: 14 tablet, Rfl: 0   amLODipine (NORVASC) 10 MG tablet, Take 1 tablet (10 mg total) by mouth daily.,  Disp: 90 tablet, Rfl: 0   fenofibrate (TRICOR) 145 MG tablet, Take 1 tablet (145 mg total) by mouth daily., Disp: 90 tablet, Rfl: 3   fluticasone (FLONASE) 50 MCG/ACT nasal spray, Place 2 sprays into both nostrils daily., Disp: 16 g, Rfl: 6   hydrochlorothiazide (HYDRODIURIL) 25 MG tablet, Take 1 tablet (25 mg total) by mouth daily. TAKE 1 TABLET DAILY (NEED AN APPOINTMENT), Disp: 90 tablet, Rfl: 0   pravastatin (PRAVACHOL) 40 MG tablet, Take 1 tablet (40 mg total) by mouth daily., Disp: 90 tablet, Rfl: 0   sertraline (ZOLOFT) 100 MG tablet, TAKE 1 TABLET DAILY, Disp: 90 tablet, Rfl: 1   sildenafil (VIAGRA) 25 MG tablet,  TAKE 1 TO 2 TABLETS BY MOUTH AS NEEDED FOR SEXUAL ACTIVITY, Disp: 10 tablet, Rfl: 1  Observations/Objective: Patient is well-developed, well-nourished in no acute distress.  Resting comfortably at home.  Head is normocephalic, atraumatic.  No labored breathing. Speech is clear and coherent with logical content.  Patient is alert and oriented at baseline.   Assessment and Plan: 1. Acute bacterial sinusitis - amoxicillin-clavulanate (AUGMENTIN) 875-125 MG tablet; Take 1 tablet by mouth 2 (two) times daily.  Dispense: 14 tablet; Refill: 0 Rx Augmentin.  Increase fluids.  Rest.  Saline nasal spray.  Probiotic.  Mucinex as directed.  Humidifier in bedroom. Continue Flonase and Zyrtec.  Call or return to clinic if symptoms are not improving.   Follow Up Instructions: I discussed the assessment and treatment plan with the patient. The patient was provided an opportunity to ask questions and all were answered. The patient agreed with the plan and demonstrated an understanding of the instructions.  A copy of instructions were sent to the patient via MyChart.  The patient was advised to call back or seek an in-person evaluation if the symptoms worsen or if the condition fails to improve as anticipated.  Time:  I spent 10 minutes with the patient via telehealth technology discussing the above problems/concerns.    Piedad Climes, PA-C

## 2021-01-21 NOTE — Patient Instructions (Signed)
Timothy Jennings, thank you for joining Piedad Climes, PA-C for today's virtual visit.  While this provider is not your primary care provider (PCP), if your PCP is located in our provider database this encounter information will be shared with them immediately following your visit.  Consent: (Patient) Timothy Jennings provided verbal consent for this virtual visit at the beginning of the encounter.  Current Medications:  Current Outpatient Medications:    amLODipine (NORVASC) 10 MG tablet, Take 1 tablet (10 mg total) by mouth daily., Disp: 90 tablet, Rfl: 0   amoxicillin-clavulanate (AUGMENTIN) 875-125 MG tablet, Take 1 tablet by mouth 2 (two) times daily., Disp: 20 tablet, Rfl: 0   cetirizine (ZYRTEC) 10 MG tablet, Take 1 tablet (10 mg total) by mouth daily., Disp: 30 tablet, Rfl: 11   fenofibrate (TRICOR) 145 MG tablet, Take 1 tablet (145 mg total) by mouth daily., Disp: 90 tablet, Rfl: 3   fluticasone (FLONASE) 50 MCG/ACT nasal spray, Place 2 sprays into both nostrils daily., Disp: 16 g, Rfl: 6   hydrochlorothiazide (HYDRODIURIL) 25 MG tablet, Take 1 tablet (25 mg total) by mouth daily. TAKE 1 TABLET DAILY (NEED AN APPOINTMENT), Disp: 90 tablet, Rfl: 0   pravastatin (PRAVACHOL) 40 MG tablet, Take 1 tablet (40 mg total) by mouth daily., Disp: 90 tablet, Rfl: 0   sertraline (ZOLOFT) 100 MG tablet, TAKE 1 TABLET DAILY, Disp: 90 tablet, Rfl: 1   sildenafil (VIAGRA) 25 MG tablet, TAKE 1 TO 2 TABLETS BY MOUTH AS NEEDED FOR SEXUAL ACTIVITY, Disp: 10 tablet, Rfl: 1   Medications ordered in this encounter:  No orders of the defined types were placed in this encounter.    *If you need refills on other medications prior to your next appointment, please contact your pharmacy*  Follow-Up: Call back or seek an in-person evaluation if the symptoms worsen or if the condition fails to improve as anticipated.  Other Instructions Please take antibiotic as directed.  Increase fluid intake.  Use  Saline nasal spray.  Take a daily multivitamin. Continue your allergy medications as directed.  Place a humidifier in the bedroom.  Please call or return clinic if symptoms are not improving.  Sinusitis Sinusitis is redness, soreness, and swelling (inflammation) of the paranasal sinuses. Paranasal sinuses are air pockets within the bones of your face (beneath the eyes, the middle of the forehead, or above the eyes). In healthy paranasal sinuses, mucus is able to drain out, and air is able to circulate through them by way of your nose. However, when your paranasal sinuses are inflamed, mucus and air can become trapped. This can allow bacteria and other germs to grow and cause infection. Sinusitis can develop quickly and last only a short time (acute) or continue over a long period (chronic). Sinusitis that lasts for more than 12 weeks is considered chronic.  CAUSES  Causes of sinusitis include: Allergies. Structural abnormalities, such as displacement of the cartilage that separates your nostrils (deviated septum), which can decrease the air flow through your nose and sinuses and affect sinus drainage. Functional abnormalities, such as when the small hairs (cilia) that line your sinuses and help remove mucus do not work properly or are not present. SYMPTOMS  Symptoms of acute and chronic sinusitis are the same. The primary symptoms are pain and pressure around the affected sinuses. Other symptoms include: Upper toothache. Earache. Headache. Bad breath. Decreased sense of smell and taste. A cough, which worsens when you are lying flat. Fatigue. Fever. Thick drainage from  your nose, which often is green and may contain pus (purulent). Swelling and warmth over the affected sinuses. DIAGNOSIS  Your caregiver will perform a physical exam. During the exam, your caregiver may: Look in your nose for signs of abnormal growths in your nostrils (nasal polyps). Tap over the affected sinus to check for  signs of infection. View the inside of your sinuses (endoscopy) with a special imaging device with a light attached (endoscope), which is inserted into your sinuses. If your caregiver suspects that you have chronic sinusitis, one or more of the following tests may be recommended: Allergy tests. Nasal culture A sample of mucus is taken from your nose and sent to a lab and screened for bacteria. Nasal cytology A sample of mucus is taken from your nose and examined by your caregiver to determine if your sinusitis is related to an allergy. TREATMENT  Most cases of acute sinusitis are related to a viral infection and will resolve on their own within 10 days. Sometimes medicines are prescribed to help relieve symptoms (pain medicine, decongestants, nasal steroid sprays, or saline sprays).  However, for sinusitis related to a bacterial infection, your caregiver will prescribe antibiotic medicines. These are medicines that will help kill the bacteria causing the infection.  Rarely, sinusitis is caused by a fungal infection. In theses cases, your caregiver will prescribe antifungal medicine. For some cases of chronic sinusitis, surgery is needed. Generally, these are cases in which sinusitis recurs more than 3 times per year, despite other treatments. HOME CARE INSTRUCTIONS  Drink plenty of water. Water helps thin the mucus so your sinuses can drain more easily. Use a humidifier. Inhale steam 3 to 4 times a day (for example, sit in the bathroom with the shower running). Apply a warm, moist washcloth to your face 3 to 4 times a day, or as directed by your caregiver. Use saline nasal sprays to help moisten and clean your sinuses. Take over-the-counter or prescription medicines for pain, discomfort, or fever only as directed by your caregiver. SEEK IMMEDIATE MEDICAL CARE IF: You have increasing pain or severe headaches. You have nausea, vomiting, or drowsiness. You have swelling around your face. You have  vision problems. You have a stiff neck. You have difficulty breathing. MAKE SURE YOU:  Understand these instructions. Will watch your condition. Will get help right away if you are not doing well or get worse. Document Released: 04/18/2005 Document Revised: 07/11/2011 Document Reviewed: 05/03/2011 [facility] Patient Information 2014 [place], [place].    If you have been instructed to have an in-person evaluation today at a local Urgent Care facility, please use the link below. It will take you to a list of all of our available [facility], including address, phone number and hours of operation. Please do not delay care.  [facility]  If you or a family member do not have a primary care provider, use the link below to schedule a visit and establish care. When you choose a [facility] primary care physician or advanced practice provider, you gain a long-term partner in health. Find a Primary Care Provider  Learn more about [facility]'s in-office and virtual care options: [facility] - Get Care Now

## 2021-01-24 ENCOUNTER — Emergency Department (HOSPITAL_BASED_OUTPATIENT_CLINIC_OR_DEPARTMENT_OTHER)
Admission: EM | Admit: 2021-01-24 | Discharge: 2021-01-24 | Disposition: A | Discharge status: Home / Self Care | Payer: BC Managed Care – PPO | Attending: Emergency Medicine | Admitting: Emergency Medicine

## 2021-01-24 ENCOUNTER — Encounter (HOSPITAL_BASED_OUTPATIENT_CLINIC_OR_DEPARTMENT_OTHER): Payer: Self-pay | Admitting: Emergency Medicine

## 2021-01-24 ENCOUNTER — Emergency Department (HOSPITAL_BASED_OUTPATIENT_CLINIC_OR_DEPARTMENT_OTHER): Payer: BC Managed Care – PPO

## 2021-01-24 ENCOUNTER — Other Ambulatory Visit: Payer: Self-pay

## 2021-01-24 DIAGNOSIS — S91114A Laceration without foreign body of right lesser toe(s) without damage to nail, initial encounter: Secondary | ICD-10-CM | POA: Insufficient documentation

## 2021-01-24 DIAGNOSIS — W208XXA Other cause of strike by thrown, projected or falling object, initial encounter: Secondary | ICD-10-CM | POA: Insufficient documentation

## 2021-01-24 DIAGNOSIS — S91119A Laceration without foreign body of unspecified toe without damage to nail, initial encounter: Secondary | ICD-10-CM

## 2021-01-24 DIAGNOSIS — Z23 Encounter for immunization: Secondary | ICD-10-CM | POA: Insufficient documentation

## 2021-01-24 DIAGNOSIS — M79674 Pain in right toe(s): Secondary | ICD-10-CM | POA: Diagnosis not present

## 2021-01-24 DIAGNOSIS — Z79899 Other long term (current) drug therapy: Secondary | ICD-10-CM | POA: Diagnosis not present

## 2021-01-24 DIAGNOSIS — I10 Essential (primary) hypertension: Secondary | ICD-10-CM | POA: Diagnosis not present

## 2021-01-24 DIAGNOSIS — Z87891 Personal history of nicotine dependence: Secondary | ICD-10-CM | POA: Insufficient documentation

## 2021-01-24 DIAGNOSIS — S99921A Unspecified injury of right foot, initial encounter: Secondary | ICD-10-CM | POA: Diagnosis not present

## 2021-01-24 HISTORY — DX: Essential (primary) hypertension: I10

## 2021-01-24 MED ORDER — TETANUS-DIPHTH-ACELL PERTUSSIS 5-2.5-18.5 LF-MCG/0.5 IM SUSY
0.5000 mL | PREFILLED_SYRINGE | Freq: Once | INTRAMUSCULAR | Status: AC
  Administered 2021-01-24: 0.5 mL via INTRAMUSCULAR
  Filled 2021-01-24: qty 0.5

## 2021-01-24 NOTE — ED Notes (Signed)
Dermabond applied by provider. 

## 2021-01-24 NOTE — ED Provider Notes (Signed)
[facility] EMERGENCY DEPT Provider Note   CSN: [id number] Arrival date & time: 01/24/21  1206     History Chief Complaint  Patient presents with   Laceration    Right 4th toe    Timothy Jennings is a 55 y.o. male here with laceration to right 4th toe, dropped a drill hammer onto his foot today, punctured his toe.  Bleeding has stopped since arrival.  Last tetanus shot was 9 years ago via PCP.  He is not on A/C.  HPI     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/01/2015   Noted with spurring and subluxation on [place]    Otitis media    followed by ENT    Trigger finger, acquired 07/01/2015   Involves 5th MCP on RT with nodule noted on [place]  Thumb may rarely trigger     Patient Active Problem List   Diagnosis Date Noted   Hypertension 08/18/2019   Closed fracture of lateral portion of right tibial plateau 12/08/2017   BMI 32.0-32.9,adult 08/08/2016   Hyperglycemia 08/08/2016   Seasonal allergies 01/30/2015   Hyperlipidemia 02/21/2012    Past Surgical History:  Procedure Laterality Date   BILATERAL CARPAL TUNNEL RELEASE     EYE SURGERY     in childhood to correct muscle imbalance   HEMORROIDECTOMY     NECK SURGERY  2007   bone spur C5-6   ORIF TIBIA PLATEAU Right 12/08/2017   Procedure: OPEN REDUCTION INTERNAL FIXATION (ORIF) LATERAL TIBIAL PLATEAU;  Surgeon: Yolonda Kida, MD;  Location: [facility] OR;  Service: Orthopedics;  Laterality: Right;   TYMPANOSTOMY TUBE PLACEMENT     VASECTOMY         Family History  Problem Relation Age of Onset   Colon cancer Mother 17       dx at 66   High blood pressure Mother    Hyperlipidemia Father    High blood pressure Father    Healthy Maternal Grandmother    Healthy Maternal Grandfather        COVID   Hypertension Paternal Grandmother    Heart disease Paternal Grandmother    Hypertension Paternal  Grandfather    Heart disease Paternal Grandfather    Alcohol abuse Other        Grandparent   Colon polyps Neg Hx    Esophageal cancer Neg Hx    Rectal cancer Neg Hx    Stomach cancer Neg Hx     Social History   Tobacco Use   Smoking status: Former    Packs/day: 2.00    Years: 20.00    Pack years: 40.00    Types: E-cigarettes, Cigarettes   Smokeless tobacco: Never  Vaping Use   Vaping Use: Never used  Substance Use Topics   Alcohol use: Yes    Alcohol/week: 14.0 standard drinks    Types: 14 Cans of beer per week    Comment: 3-4 per week   Drug use: No    Home Medications Prior to Admission medications   Medication Sig Start Date End Date Taking? Authorizing Provider  amLODipine (NORVASC) 10 MG tablet Take 1 tablet (10 mg total) by mouth daily. 09/21/20   Wynn Banker, MD  amoxicillin-clavulanate (AUGMENTIN) 875-125 MG tablet Take 1 tablet by mouth 2 (two) times daily. 01/21/21   Waldon Merl, PA-C  fenofibrate (TRICOR) 145  MG tablet Take 1 tablet (145 mg total) by mouth daily. 08/05/20   Koberlein, Paris Lore, MD  fluticasone (FLONASE) 50 MCG/ACT nasal spray Place 2 sprays into both nostrils daily. 06/07/19   Wynn Banker, MD  hydrochlorothiazide (HYDRODIURIL) 25 MG tablet Take 1 tablet (25 mg total) by mouth daily. TAKE 1 TABLET DAILY (NEED AN APPOINTMENT) 11/03/20   Wynn Banker, MD  pravastatin (PRAVACHOL) 40 MG tablet Take 1 tablet (40 mg total) by mouth daily. 09/21/20   Wynn Banker, MD  sertraline (ZOLOFT) 100 MG tablet TAKE 1 TABLET DAILY 07/24/20   Koberlein, Jannette Spanner C, MD  sildenafil (VIAGRA) 25 MG tablet TAKE 1 TO 2 TABLETS BY MOUTH AS NEEDED FOR SEXUAL ACTIVITY 12/24/19   Wynn Banker, MD    Allergies    Other  Review of Systems   Review of Systems  Constitutional:  Negative for chills and fever.  Musculoskeletal:  Positive for arthralgias and myalgias.  Skin:  Positive for wound. Negative for rash.  Neurological:  Negative for  weakness and numbness.   Physical Exam Updated Vital Signs BP (!) 122/91 (BP Location: Right Arm)   Pulse 84   Temp 98.1 F (36.7 C) (Oral)   Resp 14   Ht 5\' 10"  (1.778 m)   Wt 106.6 kg   SpO2 98%   BMI 33.72 kg/m   Physical Exam Constitutional:      General: He is not in acute distress. HENT:     Head: Normocephalic and atraumatic.  Eyes:     Conjunctiva/sclera: Conjunctivae normal.     Pupils: Pupils are equal, round, and reactive to light.  Skin:    General: Skin is warm and dry.     Comments: 0.5 cm laceration to right distal 4th toe, no after bleeding  Neurological:     General: No focal deficit present.     Mental Status: He is alert. Mental status is at baseline.    ED Results / Procedures / Treatments   Labs (all labs ordered are listed, but only abnormal results are displayed) Labs Reviewed - No data to display  EKG None  Radiology DG Toe 4th Right  Result Date: 01/24/2021 CLINICAL DATA:  Dropped a drill on the right fourth toe. Complaining of pain. EXAM: RIGHT FOURTH TOE: 3 VIEWS COMPARISON:  None. FINDINGS: No fracture. No bone lesion. Joints normally spaced and aligned. Soft tissues are unremarkable. IMPRESSION: Negative. Electronically Signed   By: 01/26/2021 M.D.   On: 01/24/2021 13:38    Procedures .09/27/2022Laceration Repair  Date/Time: 01/24/2021 5:14 PM Performed by: 01/26/2021, MD Authorized by: Terald Sleeper, MD   Consent:    Consent obtained:  Verbal   Consent given by:  Patient   Risks, benefits, and alternatives were discussed: yes     Risks discussed:  Pain and poor cosmetic result Universal protocol:    Procedure explained and questions answered to patient or proxy's satisfaction: yes     Site/side marked: yes     Immediately prior to procedure, a time out was called: yes     Patient identity confirmed:  Arm band Anesthesia:    Anesthesia method:  None Laceration details:    Location:  Toe   Toe location:  R fourth toe    Length (cm):  0.8   Depth (mm):  2 Exploration:    Contaminated: no   Treatment:    Area cleansed with:  Saline   Amount of cleaning:  Standard  Skin repair:    Repair method:  Tissue adhesive Approximation:    Approximation:  Close Repair type:    Repair type:  Simple Post-procedure details:    Dressing:  Non-adherent dressing   Procedure completion:  Tolerated well, no immediate complications   Medications Ordered in ED Medications  Tdap (BOOSTRIX) injection 0.5 mL (0.5 mLs Intramuscular Given 01/24/21 1500)    ED Course  I have reviewed the triage vital signs and the nursing notes.  Pertinent labs & imaging results that were available during my care of the patient were reviewed by me and considered in my medical decision making (see chart for details).  Xray foot ordered and reviewed personally, no underlying fracture Wound irrigated and closed with dermabond, dressed with dry gauze  No evidence of infection Tdap updated  Okay for discharge   Final Clinical Impression(s) / ED Diagnoses Final diagnoses:  Laceration of toe of right foot without foreign body present or damage to nail, unspecified toe, initial encounter    Rx / DC Orders ED Discharge Orders     None        Terald Sleeper, MD 01/24/21 1715

## 2021-01-24 NOTE — ED Notes (Signed)
Dressing changed and wound cleaned with sterile saline in triage.  Minimal active bleeding at present.  .75cm laceration noted to dorsal right 4th toe.

## 2021-01-24 NOTE — ED Triage Notes (Signed)
Pt was installing blinds at home, dropped drill and drill bit landed in his 4th right toe and then bounced out from weight of the drill.  Bleeding was difficult to control, per pt.  He cleaned with peroxide and wrapped to come here.

## 2021-01-24 NOTE — Discharge Instructions (Addendum)
We cleaned your wound and put Dermabond on it.  This is a special type of glue.  This should dissolve on its own over the next week.  For the next 24 hours keep her foot dry.  Afterwards, try to keep your foot as dry as possible in the daytime, although you can shower normally at night and pat it dry.  We did update your tetanus status today with a tetanus vaccine.  Your x-ray did not show any broken bones in your toes or foot.  You can use Tylenol and ibuprofen as needed for pain.  If you notice redness streaking up your leg, begin having fevers, pus coming out of your wound, or discoloration of your toes, please come back to the ER. These are signs of an infection

## 2021-01-25 ENCOUNTER — Ambulatory Visit: Payer: BC Managed Care – PPO | Admitting: Family Medicine

## 2021-01-25 ENCOUNTER — Encounter: Payer: Self-pay | Admitting: Family Medicine

## 2021-01-25 VITALS — BP 100/76 | HR 62 | Temp 98.4°F | Ht 70.0 in | Wt 241.5 lb

## 2021-01-25 DIAGNOSIS — I10 Essential (primary) hypertension: Secondary | ICD-10-CM | POA: Diagnosis not present

## 2021-01-25 DIAGNOSIS — E538 Deficiency of other specified B group vitamins: Secondary | ICD-10-CM

## 2021-01-25 DIAGNOSIS — J302 Other seasonal allergic rhinitis: Secondary | ICD-10-CM

## 2021-01-25 DIAGNOSIS — S91109S Unspecified open wound of unspecified toe(s) without damage to nail, sequela: Secondary | ICD-10-CM

## 2021-01-25 DIAGNOSIS — S91109D Unspecified open wound of unspecified toe(s) without damage to nail, subsequent encounter: Secondary | ICD-10-CM

## 2021-01-25 DIAGNOSIS — E785 Hyperlipidemia, unspecified: Secondary | ICD-10-CM

## 2021-01-25 DIAGNOSIS — R739 Hyperglycemia, unspecified: Secondary | ICD-10-CM

## 2021-01-25 DIAGNOSIS — Z23 Encounter for immunization: Secondary | ICD-10-CM

## 2021-01-25 NOTE — Addendum Note (Signed)
Addended by: Johnella Moloney on: 01/25/2021 10:18 AM   Modules accepted: Orders

## 2021-01-25 NOTE — Progress Notes (Signed)
Timothy Jennings DOB: [birth year] Encounter date: 01/25/2021  This is a 55 y.o. male who presents with Chief Complaint  Patient presents with   Follow-up    History of present illness: Patient had emergency room visit yesterday for laceration to the right fourth toe from a drill hammer.  Wound was irrigated, foot was x-rayed (no fracture), and wound closed with Dermabond. Not bleeding, black and blue this morning; more tender.   Last visit with me was in March for physical.  Follow-up visit was scheduled in May, patient no showed for this visit.  At that time he requested seeing a different sleep specialist. He is working through old bill that is due.   Hypertension: Amlodipine 10 mg, hydrochlorothiazide 25 mg.  Blood pressure is elevated at last visit. Hasn't been checking pressures at home, but looked good at hospital yesterday. Eating better and losing weight - more fruits, veggies. Goal is around 205.   Had fever x 2 days last week and did telehealth visit -treated with augmentin and starting to feel better. Fever went away on Friday afternoon. Ear still clogged, still leaking. Had been draining from right ear. Tends to have issues at some point every fall. Using flonase regularly and zyrtec regularly.    Hyperlipidemia: Fenofibrate 50 mg, pravastatin 40 mg - still tolerating both of these.  Depression: Zoloft increased to 100 mg at last visit. Mood has been good with this.     Allergies  Allergen Reactions   Other Hives and Swelling    Tree nuts   Current Meds  Medication Sig   amLODipine (NORVASC) 10 MG tablet Take 1 tablet (10 mg total) by mouth daily.   amoxicillin-clavulanate (AUGMENTIN) 875-125 MG tablet Take 1 tablet by mouth 2 (two) times daily.   fenofibrate (TRICOR) 145 MG tablet Take 1 tablet (145 mg total) by mouth daily.   fluticasone (FLONASE) 50 MCG/ACT nasal spray Place 2 sprays into both nostrils daily.   hydrochlorothiazide (HYDRODIURIL) 25 MG tablet Take 1  tablet (25 mg total) by mouth daily. TAKE 1 TABLET DAILY (NEED AN APPOINTMENT)   pravastatin (PRAVACHOL) 40 MG tablet Take 1 tablet (40 mg total) by mouth daily.   sertraline (ZOLOFT) 100 MG tablet TAKE 1 TABLET DAILY   sildenafil (VIAGRA) 25 MG tablet TAKE 1 TO 2 TABLETS BY MOUTH AS NEEDED FOR SEXUAL ACTIVITY    Review of Systems  Constitutional:  Negative for chills, fatigue and fever.  HENT:  Positive for ear discharge (improving) and sinus pressure (better since starting augmentin).   Respiratory:  Negative for cough, chest tightness, shortness of breath and wheezing.   Cardiovascular:  Negative for chest pain, palpitations and leg swelling.  Skin:  Positive for wound (toe is more sore than yesterday; see hpi).  Psychiatric/Behavioral:  Sleep disturbance: slep has been better this week. The patient is not nervous/anxious.    Objective:  BP 100/76 (BP Location: Left Arm, Patient Position: Sitting, Cuff Size: Large)   Pulse 62   Temp 98.4 F (36.9 C) (Oral)   Ht 5\' 10"  (1.778 m)   Wt 241 lb 8 oz (109.5 kg)   SpO2 96%   BMI 34.65 kg/m   Weight: 241 lb 8 oz (109.5 kg)   BP Readings from Last 3 Encounters:  01/25/21 100/76  01/24/21 (!) 122/91  07/24/20 128/90   Wt Readings from Last 3 Encounters:  01/25/21 241 lb 8 oz (109.5 kg)  01/24/21 235 lb (106.6 kg)  07/24/20 243 lb 11.2 oz (110.5  kg)    Physical Exam Constitutional:      General: He is not in acute distress.    Appearance: He is well-developed.  HENT:     Head: Normocephalic and atraumatic.     Right Ear: Tympanic membrane, ear canal and external ear normal.     Left Ear: Tympanic membrane, ear canal and external ear normal.     Nose: Mucosal edema present.     Right Sinus: Maxillary sinus tenderness and frontal sinus tenderness present.     Left Sinus: Maxillary sinus tenderness and frontal sinus tenderness present.     Mouth/Throat:     Pharynx: Uvula midline.  Cardiovascular:     Rate and Rhythm: Normal  rate and regular rhythm.  Pulmonary:     Effort: Pulmonary effort is normal. No respiratory distress.     Breath sounds: Normal breath sounds. No wheezing, rhonchi or rales.  Skin:    Comments: Toe right foot - dermabond is holding. No erythema around puncture site. There is bruising mid toe and posteriorly. Toe was wrapped with antibiotic ointment and 2x2 gauze for cushion/comfort.     Assessment/Plan  1. Open toe wound, sequela Wound looks good. Discussed what to monitor for in terms of redness, drainage. Keep wound moist with abx ointment for healing.   2. Primary hypertension Improved today. Encouraged home monitoring as we may be able to decrease medication if bp continues to improve with weight loss.  - CBC with Differential/Platelet; Future - Comprehensive metabolic panel; Future  3. Hyperlipidemia, unspecified hyperlipidemia type Continue current meds; return for bloodwork. - Lipid panel; Future  4. Seasonal allergies Continue with flonase, zyrtec.   5. Hyperglycemia Recheck labs. He has improved eating and is losing weight.  - Hemoglobin A1c; Future  6. Low serum vitamin B12 Still with some fatigue, b12 in low normal range at last bloodwork. Will check some additional labs  - Vitamin B12; Future - Methylmalonic acid, serum; Future - Homocysteine; Future - Folate; Future   Return in about 6 months (around 07/25/2021) for physical exam.     Theodis Shove, MD

## 2021-01-26 ENCOUNTER — Other Ambulatory Visit: Payer: BC Managed Care – PPO

## 2021-01-26 ENCOUNTER — Other Ambulatory Visit: Payer: Self-pay | Admitting: Family Medicine

## 2021-01-30 ENCOUNTER — Other Ambulatory Visit: Payer: Self-pay | Admitting: Family Medicine

## 2021-02-02 ENCOUNTER — Other Ambulatory Visit (INDEPENDENT_AMBULATORY_CARE_PROVIDER_SITE_OTHER): Payer: BC Managed Care – PPO

## 2021-02-02 ENCOUNTER — Other Ambulatory Visit: Payer: Self-pay

## 2021-02-02 DIAGNOSIS — I10 Essential (primary) hypertension: Secondary | ICD-10-CM | POA: Diagnosis not present

## 2021-02-02 DIAGNOSIS — E785 Hyperlipidemia, unspecified: Secondary | ICD-10-CM

## 2021-02-02 DIAGNOSIS — E538 Deficiency of other specified B group vitamins: Secondary | ICD-10-CM | POA: Diagnosis not present

## 2021-02-02 DIAGNOSIS — R739 Hyperglycemia, unspecified: Secondary | ICD-10-CM

## 2021-02-02 DIAGNOSIS — D519 Vitamin B12 deficiency anemia, unspecified: Secondary | ICD-10-CM | POA: Diagnosis not present

## 2021-02-02 LAB — COMPREHENSIVE METABOLIC PANEL
ALT: 19 U/L (ref 0–53)
AST: 21 U/L (ref 0–37)
Albumin: 4.1 g/dL (ref 3.5–5.2)
Alkaline Phosphatase: 88 U/L (ref 39–117)
BUN: 18 mg/dL (ref 6–23)
CO2: 27 mEq/L (ref 19–32)
Calcium: 8.8 mg/dL (ref 8.4–10.5)
Chloride: 104 mEq/L (ref 96–112)
Creatinine, Ser: 0.86 mg/dL (ref 0.40–1.50)
GFR: 98.76 mL/min (ref >60.00)
Glucose, Bld: 100 mg/dL — ABNORMAL HIGH (ref 70–99)
Potassium: 4.2 mEq/L (ref 3.5–5.1)
Sodium: 139 mEq/L (ref 135–145)
Total Bilirubin: 0.3 mg/dL (ref 0.2–1.2)
Total Protein: 7.1 g/dL (ref 6.0–8.3)

## 2021-02-02 LAB — CBC WITH DIFFERENTIAL/PLATELET
Basophils Absolute: 0.1 10*3/uL (ref 0.0–0.1)
Basophils Relative: 0.8 % (ref 0.0–3.0)
Eosinophils Absolute: 0.2 10*3/uL (ref 0.0–0.7)
Eosinophils Relative: 2.8 % (ref 0.0–5.0)
HCT: 41.7 % (ref 39.0–52.0)
Hemoglobin: 14 g/dL (ref 13.0–17.0)
Lymphocytes Relative: 13.3 % (ref 12.0–46.0)
Lymphs Abs: 1.2 10*3/uL (ref 0.7–4.0)
MCHC: 33.6 g/dL (ref 30.0–36.0)
MCV: 89.1 fl (ref 78.0–100.0)
Monocytes Absolute: 0.7 10*3/uL (ref 0.1–1.0)
Monocytes Relative: 7.8 % (ref 3.0–12.0)
Neutro Abs: 6.6 10*3/uL (ref 1.4–7.7)
Neutrophils Relative %: 75.3 % (ref 43.0–77.0)
Platelets: 254 10*3/uL (ref 150.0–400.0)
RBC: 4.68 Mil/uL (ref 4.22–5.81)
RDW: 13.3 % (ref 11.5–15.5)
WBC: 8.8 10*3/uL (ref 4.0–10.5)

## 2021-02-02 LAB — LIPID PANEL
Cholesterol: 181 mg/dL (ref 0–200)
HDL: 35.1 mg/dL — ABNORMAL LOW (ref >39.00)
NonHDL: 145.49
Total CHOL/HDL Ratio: 5
Triglycerides: 313 mg/dL — ABNORMAL HIGH (ref 0.0–149.0)
VLDL: 62.6 mg/dL — ABNORMAL HIGH (ref 0.0–40.0)

## 2021-02-02 LAB — FOLATE: Folate: 12.2 ng/mL (ref >5.9)

## 2021-02-02 LAB — HEMOGLOBIN A1C: Hgb A1c MFr Bld: 6 % (ref 4.6–6.5)

## 2021-02-02 LAB — VITAMIN B12: Vitamin B-12: 298 pg/mL (ref 211–911)

## 2021-02-02 LAB — LDL CHOLESTEROL, DIRECT: Direct LDL: 106 mg/dL

## 2021-02-03 ENCOUNTER — Encounter: Payer: Self-pay | Admitting: Family Medicine

## 2021-02-03 MED ORDER — SILDENAFIL CITRATE 25 MG PO TABS: ORAL_TABLET | ORAL | 1 refills | Status: DC

## 2021-02-04 LAB — HOMOCYSTEINE: Homocysteine: 14 umol/L — ABNORMAL HIGH (ref <11.4)

## 2021-02-04 LAB — METHYLMALONIC ACID, SERUM: Methylmalonic Acid, Quant: 148 nmol/L (ref 87–318)

## 2021-02-09 ENCOUNTER — Other Ambulatory Visit: Payer: Self-pay | Admitting: Family Medicine

## 2021-02-09 DIAGNOSIS — E538 Deficiency of other specified B group vitamins: Secondary | ICD-10-CM

## 2021-03-17 ENCOUNTER — Encounter: Payer: Self-pay | Admitting: Family Medicine

## 2021-03-18 ENCOUNTER — Other Ambulatory Visit: Payer: BC Managed Care – PPO

## 2021-04-30 ENCOUNTER — Ambulatory Visit: Payer: BC Managed Care – PPO | Admitting: Family Medicine

## 2021-04-30 ENCOUNTER — Encounter: Payer: Self-pay | Admitting: Family Medicine

## 2021-04-30 VITALS — BP 162/110 | HR 78 | Temp 97.7°F | Ht 70.0 in | Wt 249.9 lb

## 2021-04-30 DIAGNOSIS — R2689 Other abnormalities of gait and mobility: Secondary | ICD-10-CM

## 2021-04-30 DIAGNOSIS — R0981 Nasal congestion: Secondary | ICD-10-CM

## 2021-04-30 DIAGNOSIS — E538 Deficiency of other specified B group vitamins: Secondary | ICD-10-CM

## 2021-04-30 LAB — FOLATE: Folate: >24.2 ng/mL (ref >5.9)

## 2021-04-30 LAB — VITAMIN B12: Vitamin B-12: 471 pg/mL (ref 211–911)

## 2021-04-30 MED ORDER — PRAVASTATIN SODIUM 40 MG PO TABS: 40.0000 mg | ORAL_TABLET | Freq: Every day | ORAL | 1 refills | Status: DC

## 2021-04-30 MED ORDER — FENOFIBRATE 145 MG PO TABS: 145.0000 mg | ORAL_TABLET | Freq: Every day | ORAL | 3 refills | Status: DC

## 2021-04-30 MED ORDER — IPRATROPIUM BROMIDE 0.06 % NA SOLN: 2.0000 | Freq: Four times a day (QID) | NASAL | 12 refills | Status: DC

## 2021-04-30 MED ORDER — AMLODIPINE BESYLATE 10 MG PO TABS: 10.0000 mg | ORAL_TABLET | Freq: Every day | ORAL | 1 refills | Status: DC

## 2021-04-30 MED ORDER — MECLIZINE HCL 25 MG PO TABS: 25.0000 mg | ORAL_TABLET | Freq: Three times a day (TID) | ORAL | 0 refills | Status: DC | PRN

## 2021-04-30 NOTE — Patient Instructions (Signed)
Benign Positional Vertigo  Vertigo is the feeling that you or your surroundings are moving when they are not. Benign positional vertigo is the most common form of vertigo. The cause of this condition is not serious (is benign). This condition is triggered by certain movements and positions (is positional). This condition can be dangerous if it occurs while you are doing something that could endanger you or others, such as driving. What are the causes? In many cases, the cause of this condition is not known. It may be caused by a disturbance in an area of the inner ear that helps your brain to sense movement and balance. This disturbance can be caused by a viral infection (labyrinthitis), head injury, or repetitive motion. What increases the risk? This condition is more likely to develop in: Women. People who are 18 years of age or older.   What are the signs or symptoms? Symptoms of this condition usually happen when you move your head or your eyes in different directions. Symptoms may start suddenly, and they usually last for less than a minute. Symptoms may include: Loss of balance and falling. Feeling like you are spinning or moving. Feeling like your surroundings are spinning or moving. Nausea and vomiting. Blurred vision. Dizziness. Involuntary eye movement (nystagmus).   Symptoms can be mild and cause only slight annoyance, or they can be severe and interfere with daily life. Episodes of benign positional vertigo may return (recur) over time, and they may be triggered by certain movements. Symptoms may improve over time. How is this diagnosed? This condition is usually diagnosed by medical history and a physical exam of the head, neck, and ears. You may be referred to a health care provider who specializes in ear, nose, and throat (ENT) problems (otolaryngologist) or a provider who specializes in disorders of the nervous system (neurologist). You may have additional testing,  including: MRI. A CT scan. Eye movement tests. Your health care provider may ask you to change positions quickly while he or she watches you for symptoms of benign positional vertigo, such as nystagmus. Eye movement may be tested with an electronystagmogram (ENG), caloric stimulation, the Dix-Hallpike test, or the roll test. An electroencephalogram (EEG). This records electrical activity in your brain. Hearing tests.   How is this treated? Usually, your health care provider will treat this by moving your head in specific positions to adjust your inner ear back to normal. Surgery may be needed in severe cases, but this is rare. In some cases, benign positional vertigo may resolve on its own in 2-4 weeks. Follow these instructions at home: Safety Move slowly.Avoid sudden body or head movements. Avoid driving. Avoid operating heavy machinery. Avoid doing any tasks that would be dangerous to you or others if a vertigo episode would occur. If you have trouble walking or keeping your balance, try using a cane for stability. If you feel dizzy or unstable, sit down right away. Return to your normal activities as told by your health care provider. Ask your health care provider what activities are safe for you. General instructions Take over-the-counter and prescription medicines only as told by your health care provider. Avoid certain positions or movements as told by your health care provider. Drink enough fluid to keep your urine clear or pale yellow. Keep all follow-up visits as told by your health care provider. This is important. Contact a health care provider if: You have a fever. Your condition gets worse or you develop new symptoms. Your family or friends notice  any behavioral changes. Your nausea or vomiting gets worse. You have numbness or a pins and needles sensation. Get help right away if: You have difficulty speaking or moving. You are always dizzy. You faint. You develop  severe headaches. You have weakness in your legs or arms. You have changes in your hearing or vision. You develop a stiff neck. You develop sensitivity to light. This information is not intended to replace advice given to you by your health care provider. Make sure you discuss any questions you have with your health care provider. Document Released: 01/24/2006 Document Revised: 09/24/2015 Document Reviewed: 08/11/2014 Elsevier Interactive Patient Education  2018 [employer].    How to Perform the Epley Maneuver  *Alpha Gula, MD vertigo description and maneuver/[facility] you tube or half somersault maneuver  The Epley maneuver is an exercise that relieves symptoms of vertigo. Vertigo is the feeling that you or your surroundings are moving when they are not. When you feel vertigo, you may feel like the room is spinning and have trouble walking. Dizziness is a little different than vertigo. When you are dizzy, you may feel unsteady or light-headed. You can do this maneuver at home whenever you have symptoms of vertigo. You can do it up to 3 times a day until your symptoms go away. Even though the Epley maneuver may relieve your vertigo for a few weeks, it is possible that your symptoms will return. This maneuver relieves vertigo, but it does not relieve dizziness. What are the risks? If it is done correctly, the Epley maneuver is considered safe. Sometimes it can lead to dizziness or nausea that goes away after a short time. If you develop other symptoms, such as changes in vision, weakness, or numbness, stop doing the maneuver and call your health care provider. How to perform the Epley maneuver Sit on the edge of a bed or table with your back straight and your legs extended or hanging over the edge of the bed or table. Turn your head halfway toward the affected ear or side. Lie backward quickly with your head turned until you are lying flat on your back. You may want to position a pillow  under your shoulders. Hold this position for 30 seconds. You may experience an attack of vertigo. This is normal. Turn your head to the opposite direction until your unaffected ear is facing the floor. Hold this position for 30 seconds. You may experience an attack of vertigo. This is normal. Hold this position until the vertigo stops. Turn your whole body to the same side as your head. Hold for another 30 seconds. Sit back up. You can repeat this exercise up to 3 times a day. Follow these instructions at home: After doing the Epley maneuver, you can return to your normal activities. Ask your health care provider if there is anything you should do at home to prevent vertigo. He or she may recommend that you: Keep your head raised (elevated) with two or more pillows while you sleep. Do not sleep on the side of your affected ear. Get up slowly from bed. Avoid sudden movements during the day. Avoid extreme head movement, like looking up or bending over. Contact a health care provider if: Your vertigo gets worse. You have other symptoms, including: Nausea. Vomiting. Headache. Get help right away if: You have vision changes. You have a severe or worsening headache or neck pain. You cannot stop vomiting. You have new numbness or weakness in any part of your body. Summary  Vertigo is the feeling that you or your surroundings are moving when they are not. The Epley maneuver is an exercise that relieves symptoms of vertigo. If the Epley maneuver is done correctly, it is considered safe. You can do it up to 3 times a day. This information is not intended to replace advice given to you by your health care provider. Make sure you discuss any questions you have with your health care provider. Document Released: 04/23/2013 Document Revised: 03/08/2016 Document Reviewed: 03/08/2016 Elsevier Interactive Patient Education  2017 [employer].

## 2021-04-30 NOTE — Progress Notes (Signed)
Timothy Jennings DOB: [birth year] Encounter date: 04/30/2021  This is a 55 y.o. 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   First time vertigo happened was after thanksgiving. Was very bad that time - was like he had a ton of alcohol to drink. Worse with laying down under truck to do work. Kind of went away, but then came back last week.if getting up too quickly, will note it. Laying on side helps to slow dizzy sensation before getting up. Not spinning, just very off balance. Seemed to hit him out of the blue. Right ear issue is just in last week. Did feel that tinnitus was worse for a bit. Right ear feels almost normal. Gets deep itch. Left ear feels congested.   Has been out of medications - including amlodipine.   No headaches. No vision changes. No issues with feeling off balance with lying, sitting, standing. More issue with change in position.   Sx are about the same as last week. Better than when they started.    Allergies  Allergen Reactions   Other Hives and Swelling    Tree nuts   Current Meds  Medication Sig   fluticasone (FLONASE) 50 MCG/ACT nasal spray Place 2 sprays into both nostrils daily.   hydrochlorothiazide (HYDRODIURIL) 25 MG tablet TAKE 1 TABLET (25 MG TOTAL) BY MOUTH DAILY. TAKE 1 TABLET DAILY (NEED AN APPOINTMENT)   ipratropium (ATROVENT) 0.06 % nasal spray Place 2 sprays into both nostrils 4 (four) times daily.   meclizine (ANTIVERT) 25 MG tablet Take 1 tablet (25 mg total) by mouth 3 (three) times daily as needed for dizziness.   sertraline (ZOLOFT) 100 MG tablet TAKE 1 TABLET BY MOUTH EVERY DAY   sildenafil (VIAGRA) 25 MG tablet TAKE 1 TO  2 TABLETS BY MOUTH AS NEEDED FOR SEXUAL ACTIVITY   [DISCONTINUED] amLODipine (NORVASC) 10 MG tablet Take 1 tablet (10 mg total) by mouth daily.   [DISCONTINUED] fenofibrate (TRICOR) 145 MG tablet Take 1 tablet (145 mg total) by mouth daily.   [DISCONTINUED] pravastatin (PRAVACHOL) 40 MG tablet Take 1 tablet (40 mg total) by mouth daily.    Review of Systems  Constitutional:  Negative for chills, fatigue and fever.  Respiratory:  Negative for cough, chest tightness, shortness of breath and wheezing.   Cardiovascular:  Negative for chest pain, palpitations and leg swelling.   Objective:  BP (!) 162/110 (BP Location: Left Arm, Patient Position: Sitting, Cuff Size: Large)    Pulse 78    Temp 97.7 F (36.5 C) (Oral)    Ht 5\' 10"  (1.778 m)    Wt 249 lb 14.4 oz (113.4 kg)    SpO2 95%    BMI 35.86 kg/m   Weight: 249 lb 14.4 oz (113.4 kg)   BP Readings from Last 3 Encounters:  04/30/21 (!) 162/110  01/25/21 100/76  01/24/21 (!) 122/91   Wt Readings from Last 3 Encounters:  04/30/21 249 lb 14.4 oz (113.4 kg)  01/25/21 241 lb 8 oz (109.5 kg)  01/24/21 235 lb (106.6 kg)    Physical Exam Constitutional:      General: He is not in acute distress.  Appearance: He is well-developed. He is not diaphoretic.  HENT:     Head: Normocephalic and atraumatic.     Right Ear: External ear normal.     Left Ear: External ear normal.  Eyes:     Conjunctiva/sclera: Conjunctivae normal.     Pupils: Pupils are equal, round, and reactive to light.  Neck:     Thyroid: No thyromegaly.  Cardiovascular:     Rate and Rhythm: Normal rate and regular rhythm.     Heart sounds: Normal heart sounds. No murmur heard.   No friction rub. No gallop.  Pulmonary:     Effort: Pulmonary effort is normal. No respiratory distress.     Breath sounds: Normal breath sounds. No wheezing or rales.  Musculoskeletal:     Cervical back: Neck supple.  Lymphadenopathy:     Cervical: No cervical adenopathy.  Skin:     General: Skin is warm and dry.  Neurological:     Mental Status: He is alert and oriented to person, place, and time.     Cranial Nerves: No cranial nerve deficit.     Motor: No abnormal muscle tone.     Deep Tendon Reflexes: Reflexes normal.     Reflex Scores:      Tricep reflexes are 2+ on the right side and 2+ on the left side.      Bicep reflexes are 2+ on the right side and 2+ on the left side.      Brachioradialis reflexes are 2+ on the right side and 2+ on the left side.      Patellar reflexes are 2+ on the right side and 2+ on the left side. Psychiatric:        Behavior: Behavior normal.    Assessment/Plan  1. Balance problem Discussed epley and somersault maneuver. Let me know if not improving after 2 weeks.  - meclizine (ANTIVERT) 25 MG tablet; Take 1 tablet (25 mg total) by mouth 3 (three) times daily as needed for dizziness.  Dispense: 30 tablet; Refill: 0  2. Nasal congestion Stop using the short acting decongestant.  - ipratropium (ATROVENT) 0.06 % nasal spray; Place 2 sprays into both nostrils 4 (four) times daily.  Dispense: 15 mL; Refill: 12   Return if symptoms worsen or fail to improve.      Theodis Shove, MD

## 2021-05-04 LAB — HOMOCYSTEINE: Homocysteine: 12.9 umol/L — ABNORMAL HIGH (ref <11.4)

## 2021-05-04 LAB — METHYLMALONIC ACID, SERUM: Methylmalonic Acid, Quant: 114 nmol/L (ref 87–318)

## 2021-06-30 DIAGNOSIS — Z20822 Contact with and (suspected) exposure to covid-19: Secondary | ICD-10-CM | POA: Diagnosis not present

## 2021-06-30 DIAGNOSIS — H66001 Acute suppurative otitis media without spontaneous rupture of ear drum, right ear: Secondary | ICD-10-CM | POA: Diagnosis not present

## 2021-06-30 DIAGNOSIS — Z03818 Encounter for observation for suspected exposure to other biological agents ruled out: Secondary | ICD-10-CM | POA: Diagnosis not present

## 2021-11-01 ENCOUNTER — Other Ambulatory Visit: Payer: Self-pay | Admitting: *Deleted

## 2021-11-01 ENCOUNTER — Encounter: Payer: Self-pay | Admitting: Family Medicine

## 2021-11-01 ENCOUNTER — Ambulatory Visit: Payer: BC Managed Care – PPO | Admitting: Family Medicine

## 2021-11-01 VITALS — BP 132/90 | HR 75 | Temp 98.1°F | Ht 70.0 in | Wt 243.3 lb

## 2021-11-01 DIAGNOSIS — R0981 Nasal congestion: Secondary | ICD-10-CM

## 2021-11-01 DIAGNOSIS — M542 Cervicalgia: Secondary | ICD-10-CM

## 2021-11-01 MED ORDER — METHOCARBAMOL 500 MG PO TABS: 500.0000 mg | ORAL_TABLET | Freq: Three times a day (TID) | ORAL | 0 refills | Status: DC | PRN

## 2021-11-01 NOTE — Progress Notes (Signed)
Established Patient Office Visit  Subjective   Patient ID: Timothy Jennings, male    DOB: Jul 12, 1965  Age: 56 y.o. MRN: [id number]  Chief Complaint  Patient presents with   Neck Pain    Patient complains of left lateral neck pain x3 months, no known injury, tried IcyHot, Aleve and Aspercreme and heat with no relief    HPI   Seen with 6-month history of left-sided neck pain.  No known injury.  Remote history around 2008 cervical neck surgery C5-6.  His current pain though is actually just below the occipital and the left side.  Worse with neck movement especially lateral bending.  He is tried some topical sports creams and over-the-counter anti-inflammatories without much improvement.  Denies any left upper extremity numbness or weakness.  No adenopathy.  His job is maintenance supervision for apartment complex.  Does some lifting but somewhat limited in heavy lifting.  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/01/2015   Noted with spurring and subluxation on [place]    Otitis media    followed by ENT    Trigger finger, acquired 07/01/2015   Involves 5th MCP on RT with nodule noted on [place]  Thumb may rarely trigger    Past Surgical History:  Procedure Laterality Date   BILATERAL CARPAL TUNNEL RELEASE     EYE SURGERY     in childhood to correct muscle imbalance   HEMORROIDECTOMY     NECK SURGERY  2007   bone spur C5-6   ORIF TIBIA PLATEAU Right 12/08/2017   Procedure: OPEN REDUCTION INTERNAL FIXATION (ORIF) LATERAL TIBIAL PLATEAU;  Surgeon: Yolonda Kida, MD;  Location: [facility] OR;  Service: Orthopedics;  Laterality: Right;   TYMPANOSTOMY TUBE PLACEMENT     VASECTOMY      reports that he has quit smoking. His smoking use included e-cigarettes and cigarettes. He has a 40.00 pack-year smoking history. He has never used smokeless tobacco. He reports current alcohol use of about 14.0  standard drinks of alcohol per week. He reports that he does not use drugs. family history includes Alcohol abuse in an other family member; Colon cancer (age of onset: 35) in his mother; Healthy in his maternal grandfather and maternal grandmother; Heart disease in his paternal grandfather and paternal grandmother; High blood pressure in his father and mother; Hyperlipidemia in his father; Hypertension in his paternal grandfather and paternal grandmother. Allergies  Allergen Reactions   Other Hives and Swelling    Tree nuts    Review of Systems  Constitutional:  Negative for chills and fever.  Cardiovascular:  Negative for chest pain.  Neurological:  Negative for tingling and weakness.      Objective:     BP 132/90 (BP Location: Left Arm, Patient Position: Sitting, Cuff Size: Large)   Pulse 75   Temp 98.1 F (36.7 C) (Oral)   Ht 5\' 10"  (1.778 m)   Wt 243 lb 4.8 oz (110.4 kg)   SpO2 98%   BMI 34.91 kg/m    Physical Exam Vitals reviewed.  Constitutional:      Appearance: Normal appearance.  Neck:     Comments: Supple.  No adenopathy noted.  Minimal tenderness left paracervical region just below the occiput.  No visible swelling.  No erythema.  No warmth. No mastoid tenderness Cardiovascular:     Rate and Rhythm: Normal rate and  regular rhythm.  Pulmonary:     Effort: Pulmonary effort is normal.     Breath sounds: Normal breath sounds.  Musculoskeletal:     Cervical back: No tenderness.  Neurological:     Mental Status: He is alert.     Comments: Full strength upper extremities.  1+ reflexes throughout.      No results found for any visits on 11/01/21.    The 10-year ASCVD risk score (Arnett DK, et al., 2019) is: 7.7%    Assessment & Plan:   Left cervical neck pain.  This sounds more likely musculoskeletal.  Given location likely muscular etiology Consider trial of Robaxin 500 mg every 6-8 hours as needed Continue heat and muscle massage.  Consider trial of  physical therapy if not improving within the next week or 2.  No follow-ups on file.    Evelena Peat, MD

## 2021-11-01 NOTE — Telephone Encounter (Signed)
Patient came in to see Dr Marlana Latus he is out and requested refills on all maintenance medications as [facility] lost/or was not able to transfer to [place] at [place].  Message sent to [facility].

## 2021-11-01 NOTE — Patient Instructions (Signed)
Please let me know if pain not improving over the next couple of weeks with muscle relaxer.

## 2021-11-03 MED ORDER — SERTRALINE HCL 100 MG PO TABS: ORAL_TABLET | ORAL | 1 refills | Status: DC

## 2021-11-03 MED ORDER — PRAVASTATIN SODIUM 40 MG PO TABS: 40.0000 mg | ORAL_TABLET | Freq: Every day | ORAL | 1 refills | Status: AC

## 2021-11-03 MED ORDER — HYDROCHLOROTHIAZIDE 25 MG PO TABS: 25.0000 mg | ORAL_TABLET | Freq: Every day | ORAL | 1 refills | Status: AC

## 2021-11-03 MED ORDER — AMLODIPINE BESYLATE 10 MG PO TABS: 10.0000 mg | ORAL_TABLET | Freq: Every day | ORAL | 1 refills | Status: AC

## 2021-11-03 MED ORDER — IPRATROPIUM BROMIDE 0.06 % NA SOLN: 2.0000 | Freq: Four times a day (QID) | NASAL | 12 refills | Status: DC

## 2021-11-03 MED ORDER — FENOFIBRATE 145 MG PO TABS: 145.0000 mg | ORAL_TABLET | Freq: Every day | ORAL | 3 refills | Status: DC

## 2021-12-17 ENCOUNTER — Other Ambulatory Visit: Payer: Self-pay | Admitting: Family Medicine

## 2022-04-04 DIAGNOSIS — I10 Essential (primary) hypertension: Secondary | ICD-10-CM | POA: Diagnosis not present

## 2022-04-04 DIAGNOSIS — H66002 Acute suppurative otitis media without spontaneous rupture of ear drum, left ear: Secondary | ICD-10-CM | POA: Diagnosis not present

## 2022-04-04 DIAGNOSIS — Z6833 Body mass index (BMI) 33.0-33.9, adult: Secondary | ICD-10-CM | POA: Diagnosis not present

## 2022-05-13 DIAGNOSIS — H6991 Unspecified Eustachian tube disorder, right ear: Secondary | ICD-10-CM | POA: Diagnosis not present

## 2022-05-13 DIAGNOSIS — M2669 Other specified disorders of temporomandibular joint: Secondary | ICD-10-CM | POA: Diagnosis not present

## 2022-05-13 DIAGNOSIS — H9202 Otalgia, left ear: Secondary | ICD-10-CM | POA: Diagnosis not present

## 2022-05-13 DIAGNOSIS — Z9622 Myringotomy tube(s) status: Secondary | ICD-10-CM | POA: Diagnosis not present

## 2022-08-15 ENCOUNTER — Emergency Department (HOSPITAL_BASED_OUTPATIENT_CLINIC_OR_DEPARTMENT_OTHER)
Admission: EM | Admit: 2022-08-15 | Discharge: 2022-08-15 | Disposition: A | Discharge status: Home / Self Care | Payer: BC Managed Care – PPO | Attending: Emergency Medicine | Admitting: Emergency Medicine

## 2022-08-15 ENCOUNTER — Encounter (HOSPITAL_BASED_OUTPATIENT_CLINIC_OR_DEPARTMENT_OTHER): Payer: Self-pay

## 2022-08-15 ENCOUNTER — Emergency Department (HOSPITAL_BASED_OUTPATIENT_CLINIC_OR_DEPARTMENT_OTHER): Payer: BC Managed Care – PPO

## 2022-08-15 ENCOUNTER — Other Ambulatory Visit: Payer: Self-pay

## 2022-08-15 ENCOUNTER — Other Ambulatory Visit (HOSPITAL_BASED_OUTPATIENT_CLINIC_OR_DEPARTMENT_OTHER): Payer: Self-pay

## 2022-08-15 DIAGNOSIS — D72829 Elevated white blood cell count, unspecified: Secondary | ICD-10-CM | POA: Insufficient documentation

## 2022-08-15 DIAGNOSIS — N3001 Acute cystitis with hematuria: Secondary | ICD-10-CM | POA: Diagnosis not present

## 2022-08-15 DIAGNOSIS — Z79899 Other long term (current) drug therapy: Secondary | ICD-10-CM | POA: Insufficient documentation

## 2022-08-15 DIAGNOSIS — R109 Unspecified abdominal pain: Secondary | ICD-10-CM | POA: Diagnosis not present

## 2022-08-15 DIAGNOSIS — I10 Essential (primary) hypertension: Secondary | ICD-10-CM | POA: Diagnosis not present

## 2022-08-15 DIAGNOSIS — K760 Fatty (change of) liver, not elsewhere classified: Secondary | ICD-10-CM | POA: Diagnosis not present

## 2022-08-15 LAB — CBC WITH DIFFERENTIAL/PLATELET
Abs Immature Granulocytes: 0.04 10*3/uL (ref 0.00–0.07)
Basophils Absolute: 0 10*3/uL (ref 0.0–0.1)
Basophils Relative: 0 %
Eosinophils Absolute: 0.1 10*3/uL (ref 0.0–0.5)
Eosinophils Relative: 0 %
HCT: 40.9 % (ref 39.0–52.0)
Hemoglobin: 13.9 g/dL (ref 13.0–17.0)
Immature Granulocytes: 0 %
Lymphocytes Relative: 7 %
Lymphs Abs: 1 10*3/uL (ref 0.7–4.0)
MCH: 31.1 pg (ref 26.0–34.0)
MCHC: 34 g/dL (ref 30.0–36.0)
MCV: 91.5 fL (ref 80.0–100.0)
Monocytes Absolute: 1.1 10*3/uL — ABNORMAL HIGH (ref 0.1–1.0)
Monocytes Relative: 8 %
Neutro Abs: 11.2 10*3/uL — ABNORMAL HIGH (ref 1.7–7.7)
Neutrophils Relative %: 85 %
Platelets: 181 10*3/uL (ref 150–400)
RBC: 4.47 MIL/uL (ref 4.22–5.81)
RDW: 12.8 % (ref 11.5–15.5)
WBC: 13.3 10*3/uL — ABNORMAL HIGH (ref 4.0–10.5)
nRBC: 0 % (ref 0.0–0.2)

## 2022-08-15 LAB — URINALYSIS, ROUTINE W REFLEX MICROSCOPIC
Bilirubin Urine: NEGATIVE
Glucose, UA: NEGATIVE mg/dL
Ketones, ur: NEGATIVE mg/dL
Nitrite: NEGATIVE
Protein, ur: 30 mg/dL — AB
RBC / HPF: >50 RBC/hpf (ref 0–5)
Specific Gravity, Urine: 1.028 (ref 1.005–1.030)
WBC, UA: >50 WBC/hpf (ref 0–5)
pH: 6.5 (ref 5.0–8.0)

## 2022-08-15 LAB — BASIC METABOLIC PANEL
Anion gap: 8 (ref 5–15)
BUN: 16 mg/dL (ref 6–20)
CO2: 25 mmol/L (ref 22–32)
Calcium: 9.4 mg/dL (ref 8.9–10.3)
Chloride: 104 mmol/L (ref 98–111)
Creatinine, Ser: 0.97 mg/dL (ref 0.61–1.24)
GFR, Estimated: >60 mL/min (ref >60)
Glucose, Bld: 118 mg/dL — ABNORMAL HIGH (ref 70–99)
Potassium: 3.9 mmol/L (ref 3.5–5.1)
Sodium: 137 mmol/L (ref 135–145)

## 2022-08-15 MED ORDER — ONDANSETRON HCL 4 MG/2ML IJ SOLN
4.0000 mg | Freq: Once | INTRAMUSCULAR | Status: AC
  Administered 2022-08-15: 4 mg via INTRAVENOUS
  Filled 2022-08-15: qty 2

## 2022-08-15 MED ORDER — SODIUM CHLORIDE 0.9 % IV BOLUS
1000.0000 mL | Freq: Once | INTRAVENOUS | Status: AC
  Administered 2022-08-15: 1000 mL via INTRAVENOUS

## 2022-08-15 MED ORDER — CIPROFLOXACIN HCL 500 MG PO TABS: 500.0000 mg | ORAL_TABLET | Freq: Two times a day (BID) | ORAL | 0 refills | Status: DC

## 2022-08-15 MED ORDER — KETOROLAC TROMETHAMINE 15 MG/ML IJ SOLN
15.0000 mg | Freq: Once | INTRAMUSCULAR | Status: AC
  Administered 2022-08-15: 15 mg via INTRAVENOUS
  Filled 2022-08-15: qty 1

## 2022-08-15 NOTE — ED Provider Notes (Signed)
[facility] EMERGENCY DEPARTMENT AT [facility] Provider Note   CSN: [id number] Arrival date & time: 08/15/22  8381     History  Chief Complaint  Patient presents with   Flank Pain    Timothy Jennings is a 57 y.o. male.   Flank Pain     Patient with medical history of hyperlipidemia, hypertension presents to the emergency department due to left flank pain.  Symptoms started yesterday initially with increased urinary frequency and dysuria.  Feels a burning sensation suprapubically, it was also associated with hematuria and fever.  The pain radiates to his left flank, feels like a cramping pain.  He has had some intermittent diarrhea but denies any nausea, vomiting, constipation.  No chest pain, shortness of breath, coughing.  He did have fever this morning, took 2 Tylenol's prior to arrival.  No history of nephrolithiasis, previous abdominal surgeries.   Home Medications Prior to Admission medications   Medication Sig Start Date End Date Taking? Authorizing Provider  ciprofloxacin (CIPRO) 500 MG tablet Take 1 tablet (500 mg total) by mouth every 12 (twelve) hours. 08/15/22  Yes Theron Arista, PA-C  amLODipine (NORVASC) 10 MG tablet Take 1 tablet (10 mg total) by mouth daily. 11/03/21   Eulis Foster, FNP  fenofibrate (TRICOR) 145 MG tablet Take 1 tablet (145 mg total) by mouth daily. 11/03/21   Eulis Foster, FNP  hydrochlorothiazide (HYDRODIURIL) 25 MG tablet Take 1 tablet (25 mg total) by mouth daily. TAKE 1 TABLET DAILY (NEED AN APPOINTMENT) 11/03/21   Worthy Rancher B, FNP  ipratropium (ATROVENT) 0.06 % nasal spray Place 2 sprays into both nostrils 4 (four) times daily. 11/03/21   Eulis Foster, FNP  meclizine (ANTIVERT) 25 MG tablet Take 1 tablet (25 mg total) by mouth 3 (three) times daily as needed for dizziness. 04/30/21   Koberlein, Paris Lore, MD  methocarbamol (ROBAXIN) 500 MG tablet TAKE 1 TABLET BY MOUTH EVERY 8 HOURS AS NEEDED FOR MUSCLE SPASM 12/20/21   Burchette, Elberta Fortis, MD  pravastatin (PRAVACHOL) 40 MG tablet Take 1 tablet (40 mg total) by mouth daily. 11/03/21   Worthy Rancher B, FNP  sertraline (ZOLOFT) 100 MG tablet TAKE 1 TABLET BY MOUTH EVERY DAY 11/03/21   Worthy Rancher B, FNP  sildenafil (VIAGRA) 25 MG tablet TAKE 1 TO 2 TABLETS BY MOUTH AS NEEDED FOR SEXUAL ACTIVITY 02/03/21   Wynn Banker, MD      Allergies    Other    Review of Systems   Review of Systems  Genitourinary:  Positive for flank pain.    Physical Exam Updated Vital Signs BP 126/89 (BP Location: Left Arm)   Pulse 73   Temp 97.8 F (36.6 C) (Oral)   Resp 16   Ht 5\' 10"  (1.778 m)   Wt 106.6 kg   SpO2 95%   BMI 33.72 kg/m  Physical Exam Vitals and nursing note reviewed. Exam conducted with a chaperone present.  Constitutional:      Appearance: Normal appearance.  HENT:     Head: Normocephalic and atraumatic.  Eyes:     General: No scleral icterus.       Right eye: No discharge.        Left eye: No discharge.     Extraocular Movements: Extraocular movements intact.     Pupils: Pupils are equal, round, and reactive to light.  Cardiovascular:     Rate and Rhythm: Normal rate and regular rhythm.     Pulses:  Normal pulses.     Heart sounds: Normal heart sounds. No murmur heard.    No friction rub. No gallop.  Pulmonary:     Effort: Pulmonary effort is normal. No respiratory distress.     Breath sounds: Normal breath sounds.  Abdominal:     General: Abdomen is flat. Bowel sounds are normal. There is no distension.     Palpations: Abdomen is soft.     Tenderness: There is abdominal tenderness. There is no right CVA tenderness or left CVA tenderness.     Comments: Suprapubic tenderness  Skin:    General: Skin is warm and dry.     Coloration: Skin is not jaundiced.  Neurological:     Mental Status: He is alert. Mental status is at baseline.     Coordination: Coordination normal.     ED Results / Procedures / Treatments   Labs (all labs ordered are listed,  but only abnormal results are displayed) Labs Reviewed  URINALYSIS, ROUTINE W REFLEX MICROSCOPIC - Abnormal; Notable for the following components:      Result Value   APPearance HAZY (*)    Hgb urine dipstick LARGE (*)    Protein, ur 30 (*)    Leukocytes,Ua LARGE (*)    Bacteria, UA MANY (*)    All other components within normal limits  CBC WITH DIFFERENTIAL/PLATELET - Abnormal; Notable for the following components:   WBC 13.3 (*)    Neutro Abs 11.2 (*)    Monocytes Absolute 1.1 (*)    All other components within normal limits  BASIC METABOLIC PANEL - Abnormal; Notable for the following components:   Glucose, Bld 118 (*)    All other components within normal limits    EKG None  Radiology CT Renal Stone Study  Result Date: 08/15/2022 CLINICAL DATA:  Left flank pain radiating to the groin. Urinary frequency, hematuria. EXAM: CT ABDOMEN AND PELVIS WITHOUT CONTRAST TECHNIQUE: Multidetector CT imaging of the abdomen and pelvis was performed following the standard protocol without IV contrast. RADIATION DOSE REDUCTION: This exam was performed according to the departmental dose-optimization program which includes automated exposure control, adjustment of the mA and/or kV according to patient size and/or use of iterative reconstruction technique. COMPARISON:  None Available. FINDINGS: Lower chest: The lung bases are clear. The imaged heart is unremarkable. Hepatobiliary: The liver is markedly diffusely hypoattenuating consistent with fatty infiltration. There are no definite focal lesions. There is fatty sparing along the gallbladder fossa and falciform ligament. There is no biliary ductal dilatation. The gallbladder is unremarkable. Pancreas: Unremarkable. Spleen: Unremarkable. Adrenals/Urinary Tract: The adrenals are unremarkable. There are no focal renal lesions, within the confines of noncontrast technique. There is mild symmetric bilateral perinephric stranding, nonspecific. No stones are seen  in either kidney or along the course of either ureter. There is no hydronephrosis or hydroureter. The bladder is decompressed but grossly unremarkable. No bladder stones are seen. Stomach/Bowel: The stomach is unremarkable. There is no evidence of bowel obstruction. The appendix is not definitively identified. There is no abnormal bowel wall thickening or inflammatory change. Vascular/Lymphatic: There is scattered calcified plaque in the nonaneurysmal abdominal aorta. There is no abdominopelvic lymphadenopathy. Reproductive: The prostate and seminal vesicles are unremarkable. Other: There is no ascites or free air. There is a tiny fat containing umbilical hernia. Musculoskeletal: There is no acute osseous abnormality or suspicious osseous lesion. IMPRESSION: 1. No acute findings in the abdomen or pelvis. No renal stones or hydronephrosis. 2. Marked fatty infiltration of the liver.  Electronically Signed   By: Lesia Hausen M.D.   On: 08/15/2022 10:49    Procedures Procedures    Medications Ordered in ED Medications  sodium chloride 0.9 % bolus 1,000 mL (1,000 mLs Intravenous New Bag/Given 08/15/22 1058)  ondansetron (ZOFRAN) injection 4 mg (4 mg Intravenous Given 08/15/22 1058)  ketorolac (TORADOL) 15 MG/ML injection 15 mg (15 mg Intravenous Given 08/15/22 1059)    ED Course/ Medical Decision Making/ A&P                             Medical Decision Making Amount and/or Complexity of Data Reviewed Labs: ordered. Radiology: ordered.  Risk Prescription drug management.   Patient presents due to dysuria, hematuria and flank pain.  Differential is broad and includes sepsis, nephrolithiasis, pyelonephritis, UTI, dehydration, AKI, electrolyte derangement.  Will start with labs, urine and CT renal study to evaluate for stone.  Patient is afebrile currently does not meet SIRS criteria so lower suspicion for sepsis although he did take antipyretics prior to arrival.  Consider dissection but upper and  lower extremity pulses are symmetric, is not having any chest pain or shortness of breath.  I think PE, cardiac etiology is less likely.  UA is notable for hematuria and leukocyturia consistent with cystitis versus nephrolithiasis with secondary infection.  CBC shows leukocytosis with left shift.  BMP without gross electrolyte derangement or AKI.  CT renal study is notable for no evidence of stone or perinephritic stranding.  Given the absence of stone I am most suspicious for UTI and possibly developing pyelonephritis given his radiating to the flank.  He denies any nausea or vomiting although did have subjective fever at home.  Will cover with antibiotics and have him follow-up with urology.  There also may be a component of BPH given the hematuria and incomplete urinary emptying.  Referral to urology was provided, strict return precautions were discussed with the patient who verbalized understanding with the plan.  Stable for discharge at this time.        Final Clinical Impression(s) / ED Diagnoses Final diagnoses:  Acute cystitis with hematuria    Rx / DC Orders ED Discharge Orders          Ordered    ciprofloxacin (CIPRO) 500 MG tablet  Every 12 hours        08/15/22 1130              Theron Arista, [place] 08/15/22 1131    Alvira Monday, MD 08/15/22 2222

## 2022-08-15 NOTE — ED Notes (Signed)
Patient verbalizes understanding of discharge instructions. Opportunity for questioning and answers were provided. Patient discharged from ED.  °

## 2022-08-15 NOTE — ED Triage Notes (Signed)
Onset yesterday.  Left flank pain radiates into left groin are.  Urinary frequency.  Blood in urine  states having difficulty urinating.  Fever sat home  Took tylenol at about 2 hour ago.  No vomiting  Diarrhea

## 2022-08-15 NOTE — Discharge Instructions (Addendum)
You are seen today in the emergency department due to flank pain and hematuria.  Your UTI was concerning for urinary tract infection, the CT scan did not show a stone which is reassuring.  Take the antibiotic ciprofloxacin once in the morning and once in the evening for the next 10 days.  Call and schedule an appoint with urology for the next week, call today to schedule the next available appointment.  Return to the ED if you have severe pain, fevers, vomiting, new or worsening symptoms.

## 2022-08-26 ENCOUNTER — Other Ambulatory Visit: Payer: Self-pay | Admitting: Family

## 2022-09-02 ENCOUNTER — Other Ambulatory Visit: Payer: Self-pay

## 2022-09-04 MED ORDER — SERTRALINE HCL 100 MG PO TABS: ORAL_TABLET | ORAL | 1 refills | Status: AC

## 2022-09-23 ENCOUNTER — Encounter: Payer: Self-pay | Admitting: Internal Medicine

## 2022-11-14 ENCOUNTER — Emergency Department (HOSPITAL_BASED_OUTPATIENT_CLINIC_OR_DEPARTMENT_OTHER)
Admission: EM | Admit: 2022-11-14 | Discharge: 2022-11-14 | Disposition: A | Discharge status: Home / Self Care | Payer: BC Managed Care – PPO | Attending: Emergency Medicine | Admitting: Emergency Medicine

## 2022-11-14 ENCOUNTER — Encounter (HOSPITAL_BASED_OUTPATIENT_CLINIC_OR_DEPARTMENT_OTHER): Payer: Self-pay

## 2022-11-14 ENCOUNTER — Other Ambulatory Visit: Payer: Self-pay

## 2022-11-14 DIAGNOSIS — T23221A Burn of second degree of single right finger (nail) except thumb, initial encounter: Secondary | ICD-10-CM

## 2022-11-14 DIAGNOSIS — S6991XA Unspecified injury of right wrist, hand and finger(s), initial encounter: Secondary | ICD-10-CM | POA: Diagnosis not present

## 2022-11-14 DIAGNOSIS — I10 Essential (primary) hypertension: Secondary | ICD-10-CM | POA: Insufficient documentation

## 2022-11-14 DIAGNOSIS — T23201A Burn of second degree of right hand, unspecified site, initial encounter: Secondary | ICD-10-CM | POA: Diagnosis not present

## 2022-11-14 DIAGNOSIS — X141XXA Other contact with hot air and other hot gases, initial encounter: Secondary | ICD-10-CM | POA: Diagnosis not present

## 2022-11-14 DIAGNOSIS — T23231A Burn of second degree of multiple right fingers (nail), not including thumb, initial encounter: Secondary | ICD-10-CM | POA: Insufficient documentation

## 2022-11-14 DIAGNOSIS — Z79899 Other long term (current) drug therapy: Secondary | ICD-10-CM | POA: Diagnosis not present

## 2022-11-14 DIAGNOSIS — T310 Burns involving less than 10% of body surface: Secondary | ICD-10-CM | POA: Diagnosis not present

## 2022-11-14 MED ORDER — BACITRACIN ZINC 500 UNIT/GM EX OINT
TOPICAL_OINTMENT | Freq: Once | CUTANEOUS | Status: AC
  Administered 2022-11-14: 31.5 via TOPICAL
  Filled 2022-11-14: qty 28.35

## 2022-11-14 MED ORDER — BACITRACIN ZINC 500 UNIT/GM EX OINT: 1.0000 | TOPICAL_OINTMENT | Freq: Two times a day (BID) | CUTANEOUS | 0 refills | Status: DC

## 2022-11-14 MED ORDER — BACITRACIN ZINC 500 UNIT/GM EX OINT: TOPICAL_OINTMENT | Freq: Two times a day (BID) | CUTANEOUS | Status: DC
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
## 2022-11-14 NOTE — ED Notes (Signed)
 Reviewed AVS/discharge instruction with patient. Time allotted for and all questions answered. Patient is agreeable for d/c and escorted to ed exit by staff.  

## 2022-11-14 NOTE — Discharge Instructions (Addendum)
Please refer to the handout for wound care instruction.  Please use bacitracin ointment to prevent infection, take Tylenol/ibuprofen every 6 hours for pain.  I recommend close follow-up with PCP for reevaluation.  Please do not hesitate to return to emergency department if worrisome signs symptoms we discussed become apparent.

## 2023-01-24 DIAGNOSIS — F1090 Alcohol use, unspecified, uncomplicated: Secondary | ICD-10-CM | POA: Diagnosis not present

## 2023-01-24 DIAGNOSIS — Z131 Encounter for screening for diabetes mellitus: Secondary | ICD-10-CM | POA: Diagnosis not present

## 2023-01-24 DIAGNOSIS — F329 Major depressive disorder, single episode, unspecified: Secondary | ICD-10-CM | POA: Diagnosis not present

## 2023-01-24 DIAGNOSIS — R351 Nocturia: Secondary | ICD-10-CM | POA: Diagnosis not present

## 2023-01-24 DIAGNOSIS — Z13220 Encounter for screening for lipoid disorders: Secondary | ICD-10-CM | POA: Diagnosis not present

## 2023-01-24 DIAGNOSIS — E669 Obesity, unspecified: Secondary | ICD-10-CM | POA: Diagnosis not present

## 2023-01-24 DIAGNOSIS — Z6834 Body mass index (BMI) 34.0-34.9, adult: Secondary | ICD-10-CM | POA: Diagnosis not present

## 2023-01-24 DIAGNOSIS — I10 Essential (primary) hypertension: Secondary | ICD-10-CM | POA: Diagnosis not present

## 2023-01-24 DIAGNOSIS — Z0000 Encounter for general adult medical examination without abnormal findings: Secondary | ICD-10-CM | POA: Diagnosis not present

## 2023-01-24 DIAGNOSIS — Z23 Encounter for immunization: Secondary | ICD-10-CM | POA: Diagnosis not present

## 2023-02-03 DIAGNOSIS — R300 Dysuria: Secondary | ICD-10-CM | POA: Diagnosis not present

## 2023-02-14 DIAGNOSIS — E785 Hyperlipidemia, unspecified: Secondary | ICD-10-CM | POA: Diagnosis not present

## 2023-02-14 DIAGNOSIS — I10 Essential (primary) hypertension: Secondary | ICD-10-CM | POA: Diagnosis not present

## 2023-02-28 ENCOUNTER — Encounter: Payer: Self-pay | Admitting: Internal Medicine

## 2023-03-27 ENCOUNTER — Ambulatory Visit (AMBULATORY_SURGERY_CENTER): Payer: BC Managed Care – PPO | Admitting: *Deleted

## 2023-03-27 VITALS — Ht 70.0 in | Wt 245.0 lb

## 2023-03-27 DIAGNOSIS — Z1211 Encounter for screening for malignant neoplasm of colon: Secondary | ICD-10-CM

## 2023-03-27 MED ORDER — NA SULFATE-K SULFATE-MG SULF 17.5-3.13-1.6 GM/177ML PO SOLN
1.0000 | Freq: Once | ORAL | 0 refills | Status: AC
Start: 2023-03-27 — End: 2023-03-27

## 2023-03-27 NOTE — Progress Notes (Signed)
Pt's name and DOB verified at the beginning of the pre-visit wit 2 identifiers  Pt denies any difficulty with ambulating,sitting, laying down or rolling side to side  Pt has issues with ambulation   Pt has no issues moving head neck or swallowing  No egg or soy allergy known to patient   No issues known to pt with past sedation with any surgeries or procedures  Pt denies having issues being intubated  No FH of Malignant Hyperthermia  Pt is not on home 02   Pt is not on blood thinners   Pt denies issues with constipation    Pt is not on dialysis  Pt denise any abnormal heart rhythms   Pt denies any upcoming cardiac testing  Pt encouraged to use to use Singlecare or Goodrx to reduce cost   Patient's chart reviewed by Cathlyn Parsons CNRA prior to pre-visit and patient appropriate for the [facility].  Pre-visit completed and red dot placed by patient's name on their procedure day (on provider's schedule).  .   Pt scale weight is 245 lb  Instructed pt why it is important to and  to call if they have any changes in health or new medications. Directed them to the # given and on instructions.     Instructions reviewed. Pt given both [facility] main # and MD on call # prior to instructions.  Pt states understanding. Instructed to review again prior to procedure. Pt states they will.    Instructions and coupon given to pt

## 2023-03-28 ENCOUNTER — Encounter: Payer: Self-pay | Admitting: Internal Medicine

## 2023-04-11 ENCOUNTER — Telehealth: Payer: Self-pay | Admitting: Internal Medicine

## 2023-04-11 MED ORDER — NA SULFATE-K SULFATE-MG SULF 17.5-3.13-1.6 GM/177ML PO SOLN: 1.0000 | Freq: Once | ORAL | 0 refills | Status: AC

## 2023-04-11 NOTE — Telephone Encounter (Signed)
PT is calling to have Suprep called in to [facility] on [address]. Please advise.

## 2023-04-11 NOTE — Telephone Encounter (Signed)
Suprep sent and patient informed.

## 2023-04-15 ENCOUNTER — Encounter: Payer: Self-pay | Admitting: Certified Registered Nurse Anesthetist

## 2023-04-16 ENCOUNTER — Encounter: Payer: Self-pay | Admitting: Internal Medicine

## 2023-04-16 NOTE — Progress Notes (Unsigned)
[facility] History and Physical   Primary Care Physician:  Patient, No Pcp Per   Reason for Procedure:   Family Hx CRCA  Plan:    colonoscopy     HPI: Timothy Jennings is a 57 y.o. male here for screening exam - mother had CRCA   Past Medical History:  Diagnosis Date   Allergy    Anxiety    Chicken pox    Hyperlipidemia 02/21/2012   Hypertension    Osteoarthritis of right thumb 07/01/2015   Noted with spurring and subluxation on [place]    Otitis media    followed by ENT    Sleep apnea    Trigger finger, acquired 07/01/2015   Involves 5th MCP on RT with nodule noted on [place]  Thumb may rarely trigger     Past Surgical History:  Procedure Laterality Date   BILATERAL CARPAL TUNNEL RELEASE     COLONOSCOPY     EYE SURGERY     in childhood to correct muscle imbalance   HEMORROIDECTOMY     NECK SURGERY  2007   bone spur C5-6   ORIF TIBIA PLATEAU Right 12/08/2017   Procedure: OPEN REDUCTION INTERNAL FIXATION (ORIF) LATERAL TIBIAL PLATEAU;  Surgeon: Yolonda Kida, MD;  Location: [facility] OR;  Service: Orthopedics;  Laterality: Right;   TYMPANOSTOMY TUBE PLACEMENT     VASECTOMY      Prior to Admission medications   Medication Sig Start Date End Date Taking? Authorizing Provider  amLODipine (NORVASC) 10 MG tablet Take 1 tablet (10 mg total) by mouth daily. 11/03/21   Worthy Rancher B, FNP  bacitracin ointment Apply 1 Application topically 2 (two) times daily. Patient not taking: Reported on 03/27/2023 11/14/22   Jeanelle Malling, PA  ciprofloxacin (CIPRO) 500 MG tablet Take 1 tablet (500 mg total) by mouth every 12 (twelve) hours. 08/15/22   Theron Arista, PA-C  fenofibrate (TRICOR) 145 MG tablet Take 1 tablet (145 mg total) by mouth daily. 11/03/21   Eulis Foster, FNP  hydrochlorothiazide (HYDRODIURIL) 25 MG tablet Take 1 tablet (25 mg total) by mouth daily. TAKE 1 TABLET DAILY (NEED AN APPOINTMENT) 11/03/21   Worthy Rancher B, FNP  ipratropium (ATROVENT) 0.06 % nasal spray Place 2  sprays into both nostrils 4 (four) times daily. Patient not taking: Reported on 03/27/2023 11/03/21   Eulis Foster, FNP  meclizine (ANTIVERT) 25 MG tablet Take 1 tablet (25 mg total) by mouth 3 (three) times daily as needed for dizziness. Patient not taking: Reported on 03/27/2023 04/30/21   Wynn Banker, MD  methocarbamol (ROBAXIN) 500 MG tablet TAKE 1 TABLET BY MOUTH EVERY 8 HOURS AS NEEDED FOR MUSCLE SPASM Patient not taking: Reported on 03/27/2023 12/20/21   Kristian Covey, MD  pravastatin (PRAVACHOL) 40 MG tablet Take 1 tablet (40 mg total) by mouth daily. 11/03/21   Worthy Rancher B, FNP  sertraline (ZOLOFT) 100 MG tablet TAKE 1 TABLET BY MOUTH EVERY DAY 09/04/22   Worthy Rancher B, FNP  sildenafil (VIAGRA) 25 MG tablet TAKE 1 TO 2 TABLETS BY MOUTH AS NEEDED FOR SEXUAL ACTIVITY Patient not taking: Reported on 03/27/2023 02/03/21   Wynn Banker, MD    Current Outpatient Medications  Medication Sig Dispense Refill   amLODipine (NORVASC) 10 MG tablet Take 1 tablet (10 mg total) by mouth daily. 90 tablet 1   bacitracin ointment Apply 1 Application topically 2 (two) times daily. (Patient not taking: Reported on 03/27/2023) 120 g 0   ciprofloxacin (  CIPRO) 500 MG tablet Take 1 tablet (500 mg total) by mouth every 12 (twelve) hours. 20 tablet 0   fenofibrate (TRICOR) 145 MG tablet Take 1 tablet (145 mg total) by mouth daily. 90 tablet 3   hydrochlorothiazide (HYDRODIURIL) 25 MG tablet Take 1 tablet (25 mg total) by mouth daily. TAKE 1 TABLET DAILY (NEED AN APPOINTMENT) 90 tablet 1   ipratropium (ATROVENT) 0.06 % nasal spray Place 2 sprays into both nostrils 4 (four) times daily. (Patient not taking: Reported on 03/27/2023) 15 mL 12   meclizine (ANTIVERT) 25 MG tablet Take 1 tablet (25 mg total) by mouth 3 (three) times daily as needed for dizziness. (Patient not taking: Reported on 03/27/2023) 30 tablet 0   methocarbamol (ROBAXIN) 500 MG tablet TAKE 1 TABLET BY MOUTH EVERY 8 HOURS AS  NEEDED FOR MUSCLE SPASM (Patient not taking: Reported on 03/27/2023) 30 tablet 0   pravastatin (PRAVACHOL) 40 MG tablet Take 1 tablet (40 mg total) by mouth daily. 90 tablet 1   sertraline (ZOLOFT) 100 MG tablet TAKE 1 TABLET BY MOUTH EVERY DAY 90 tablet 1   sildenafil (VIAGRA) 25 MG tablet TAKE 1 TO 2 TABLETS BY MOUTH AS NEEDED FOR SEXUAL ACTIVITY (Patient not taking: Reported on 03/27/2023) 10 tablet 1   No current facility-administered medications for this visit.    Allergies as of 04/17/2023 - Review Complete 04/15/2023  Allergen Reaction Noted   Other Hives and Swelling 07/02/2018    Family History  Problem Relation Age of Onset   Colon cancer Mother 24       dx at 54   High blood pressure Mother    Hyperlipidemia Father    High blood pressure Father    Healthy Maternal Grandmother    Healthy Maternal Grandfather        COVID   Hypertension Paternal Grandmother    Heart disease Paternal Grandmother    Hypertension Paternal Grandfather    Heart disease Paternal Grandfather    Alcohol abuse Other        Grandparent   Colon polyps Neg Hx    Esophageal cancer Neg Hx    Rectal cancer Neg Hx    Stomach cancer Neg Hx     Social History   Socioeconomic History   Marital status: Married    Spouse name: Not on file   Number of children: Not on file   Years of education: Not on file   Highest education level: GED or equivalent  Occupational History   Not on file  Tobacco Use   Smoking status: Former    Current packs/day: 2.00    Average packs/day: 2.0 packs/day for 20.0 years (40.0 ttl pk-yrs)    Types: E-cigarettes, Cigarettes   Smokeless tobacco: Never  Vaping Use   Vaping status: Never Used  Substance and Sexual Activity   Alcohol use: Yes    Alcohol/week: 14.0 standard drinks of alcohol    Types: 14 Cans of beer per week    Comment: 3-4 per week   Drug use: No   Sexual activity: Not on file  Other Topics Concern   Not on file  Social History Narrative    Not on file   Social Drivers of Health   Financial Resource Strain: Low Risk  (01/23/2023)   [facility]   Overall Financial Resource Strain (CARDIA)    Difficulty of Paying Living Expenses: Not very hard  Food Insecurity: No Food Insecurity (01/23/2023)   Received from [facility]  Hunger Vital Sign    Worried About Running Out of Food in the Last Year: Never true    Ran Out of Food in the Last Year: Never true  Transportation Needs: No Transportation Needs (01/23/2023)   Received from [facility] - Transportation    Lack of Transportation (Medical): No    Lack of Transportation (Non-Medical): No  Physical Activity: Insufficiently Active (01/23/2023)   Received from [facility]   Exercise Vital Sign    Days of Exercise per Week: 3 days    Minutes of Exercise per Session: 30 min  Stress: Stress Concern Present (01/23/2023)   Received from [facility] - Occupational Stress Questionnaire    Feeling of Stress : To some extent  Social Connections: Moderately Integrated (01/23/2023)   Received from [facility]   Social Network    How would you rate your social network (family, work, friends)?: Adequate participation with social networks  Intimate Partner Violence: Not At Risk (01/23/2023)   Received from [facility]   HITS    Over the last 12 months how often did your partner physically hurt you?: Never    Over the last 12 months how often did your partner insult you or talk down to you?: Never    Over the last 12 months how often did your partner threaten you with physical harm?: Never    Over the last 12 months how often did your partner scream or curse at you?: Never    Review of Systems: Positive for *** All other review of systems negative except as mentioned in the HPI.  Physical Exam: Vital signs There were no vitals taken for this visit.  General:   Alert,  Well-developed, well-nourished,  pleasant and cooperative in NAD Lungs:  Clear throughout to auscultation.   Heart:  Regular rate and rhythm; no murmurs, clicks, rubs,  or gallops. Abdomen:  Soft, nontender and nondistended. Normal bowel sounds.   Neuro/Psych:  Alert and cooperative. Normal mood and affect. A and O x 3   @Elior Robinette  Sena Slate, MD, [facility] [phone] (pager) 04/16/2023 5:00 PM@

## 2023-04-17 ENCOUNTER — Ambulatory Visit (AMBULATORY_SURGERY_CENTER): Payer: BC Managed Care – PPO | Admitting: Internal Medicine

## 2023-04-17 ENCOUNTER — Encounter: Payer: Self-pay | Admitting: Internal Medicine

## 2023-04-17 VITALS — BP 134/89 | HR 75 | Temp 97.9°F | Resp 10 | Ht 70.0 in | Wt 245.0 lb

## 2023-04-17 DIAGNOSIS — K5730 Diverticulosis of large intestine without perforation or abscess without bleeding: Secondary | ICD-10-CM

## 2023-04-17 DIAGNOSIS — K621 Rectal polyp: Secondary | ICD-10-CM

## 2023-04-17 DIAGNOSIS — K623 Rectal prolapse: Secondary | ICD-10-CM

## 2023-04-17 DIAGNOSIS — Z1211 Encounter for screening for malignant neoplasm of colon: Secondary | ICD-10-CM

## 2023-04-17 DIAGNOSIS — D128 Benign neoplasm of rectum: Secondary | ICD-10-CM

## 2023-04-17 MED ORDER — SODIUM CHLORIDE 0.9 % IV SOLN
500.0000 mL | INTRAVENOUS | Status: DC
Start: 2023-04-17 — End: 2023-04-17

## 2023-04-17 NOTE — Progress Notes (Signed)
Report given to PACU, vss 

## 2023-04-17 NOTE — Progress Notes (Signed)
Called to room to assist during endoscopic procedure.  Patient ID and intended procedure confirmed with present staff. Received instructions for my participation in the procedure from the performing physician.  

## 2023-04-17 NOTE — Progress Notes (Signed)
Pt's states no medical or surgical changes since previsit or office visit. 

## 2023-04-17 NOTE — Patient Instructions (Addendum)
There was one small polyp found and removed. You also have a condition called diverticulosis - common and not usually a problem. Please read the handout provided.  I will let you know pathology results and when to have another routine colonoscopy by mail and/or My Chart.  I appreciate the opportunity to care for you. Iva Boop, MD, [facility]  Resume previous diet Continue present medications Await pathology results  Handouts/information given for polyps, diverticulosis   YOU HAD AN ENDOSCOPIC PROCEDURE TODAY AT THE [facility]:   Refer to the procedure report that was given to you for any specific questions about what was found during the examination.  If the procedure report does not answer your questions, please call your gastroenterologist to clarify.  If you requested that your care partner not be given the details of your procedure findings, then the procedure report has been included in a sealed envelope for you to review at your convenience later.  YOU SHOULD EXPECT: Some feelings of bloating in the abdomen. Passage of more gas than usual.  Walking can help get rid of the air that was put into your GI tract during the procedure and reduce the bloating. If you had a lower endoscopy (such as a colonoscopy or flexible sigmoidoscopy) you may notice spotting of blood in your stool or on the toilet paper. If you underwent a bowel prep for your procedure, you may not have a normal bowel movement for a few days.  Please Note:  You might notice some irritation and congestion in your nose or some drainage.  This is from the oxygen used during your procedure.  There is no need for concern and it should clear up in a day or so.  SYMPTOMS TO REPORT IMMEDIATELY:  Following lower endoscopy (colonoscopy):  Excessive amounts of blood in the stool  Significant tenderness or worsening of abdominal pains  Swelling of the abdomen that is new, acute  Fever of 100F or higher  For urgent or  emergent issues, a gastroenterologist can be reached at any hour by calling [phone]. Do not use MyChart messaging for urgent concerns.   DIET:  We do recommend a small meal at first, but then you may proceed to your regular diet.  Drink plenty of fluids but you should avoid alcoholic beverages for 24 hours.  ACTIVITY:  You should plan to take it easy for the rest of today and you should NOT DRIVE or use heavy machinery until tomorrow (because of the sedation medicines used during the test).    FOLLOW UP: Our staff will call the number listed on your records the next business day following your procedure.  We will call around 7:15- 8:00 am to check on you and address any questions or concerns that you may have regarding the information given to you following your procedure. If we do not reach you, we will leave a message.     If any biopsies were taken you will be contacted by phone or by letter within the next 1-3 weeks.  Please call us at [phone] if you have not heard about the biopsies in 3 weeks.   SIGNATURES/CONFIDENTIALITY: You and/or your care partner have signed paperwork which will be entered into your electronic medical record.  These signatures attest to the fact that that the information above on your After Visit Summary has been reviewed and is understood.  Full responsibility of the confidentiality of this discharge information lies with you and/or your care-partner.

## 2023-04-17 NOTE — Op Note (Signed)
[facility] Patient Name: Timothy Jennings Procedure Date: 04/17/2023 11:29 AM MRN: [id number] Endoscopist: Iva Boop , MD, [id number] Age: 57 Referring MD:  Date of Birth: 1966/02/29 Gender: Male Account #: [id number] Procedure:                Colonoscopy Indications:              Screening in patient at increased risk: Family                            history of 1st-degree relative with colorectal                            cancer Medicines:                Monitored Anesthesia Care Procedure:                Pre-Anesthesia Assessment:                           - Prior to the procedure, a History and Physical                            was performed, and patient medications and                            allergies were reviewed. The patient's tolerance of                            previous anesthesia was also reviewed. The risks                            and benefits of the procedure and the sedation                            options and risks were discussed with the patient.                            All questions were answered, and informed consent                            was obtained. Prior Anticoagulants: The patient has                            taken no anticoagulant or antiplatelet agents. ASA                            Grade Assessment: II - A patient with mild systemic                            disease. After reviewing the risks and benefits,                            the patient was deemed in satisfactory condition to  undergo the procedure.                           After obtaining informed consent, the colonoscope                            was passed under direct vision. Throughout the                            procedure, the patient's blood pressure, pulse, and                            oxygen saturations were monitored continuously. The                            CF [id number] #[id number] was introduced through the anus                             and advanced to the the cecum, identified by                            appendiceal orifice and ileocecal valve. The                            colonoscopy was performed without difficulty. The                            patient tolerated the procedure fairly well. The                            quality of the bowel preparation was good. The                            ileocecal valve, appendiceal orifice, and rectum                            were photographed. The bowel preparation used was                            Miralax via extended prep with split dose                            instruction. The bowel preparation used was SUPREP                            via extended prep with split dose instruction. Scope In: 11:45:22 AM Scope Out: 11:56:53 AM Scope Withdrawal Time: 0 hours 8 minutes 57 seconds  Total Procedure Duration: 0 hours 11 minutes 31 seconds  Findings:                 The perianal and digital rectal examinations were                            normal. Pertinent negatives include normal prostate                            (  size, shape, and consistency).                           A 6 mm polyp was found in the rectum. The polyp was                            sessile. The polyp was removed with a cold snare.                            Resection and retrieval were complete. Verification                            of patient identification for the specimen was                            done. Estimated blood loss was minimal.                           Multiple diverticula were found in the sigmoid                            colon.                           The exam was otherwise without abnormality on                            direct and retroflexion views. Complications:            No immediate complications. Estimated Blood Loss:     Estimated blood loss was minimal. Impression:               - One 6 mm polyp in the rectum, removed with a cold                             snare. Resected and retrieved.                           - Diverticulosis in the sigmoid colon.                           - The examination was otherwise normal on direct                            and retroflexion views. has FHx CRCa mother (31) Recommendation:           - Patient has a contact number available for                            emergencies. The signs and symptoms of potential                            delayed complications were discussed with the  patient. Return to normal activities tomorrow.                            Written discharge instructions were provided to the                            patient.                           - Resume previous diet.                           - Continue present medications.                           - Repeat colonoscopy is recommended. The                            colonoscopy date will be determined after pathology                            results from today's exam become available for                            review. Iva Boop, MD 04/17/2023 12:03:57 PM This report has been signed electronically.

## 2023-04-18 ENCOUNTER — Telehealth: Payer: Self-pay

## 2023-04-18 NOTE — Telephone Encounter (Signed)
  Follow up Call-     04/17/2023   10:43 AM  Call back number  Post procedure Call Back phone  # [phone]  Permission to leave phone message Yes     Patient questions:  Do you have a fever, pain , or abdominal swelling? No. Pain Score  0 *  Have you tolerated food without any problems? Yes.    Have you been able to return to your normal activities? Yes.    Do you have any questions about your discharge instructions: Diet   No. Medications  No. Follow up visit  No.  Do you have questions or concerns about your Care? No.  Actions: * If pain score is 4 or above: No action needed, pain <4.

## 2023-04-19 ENCOUNTER — Encounter: Payer: Self-pay | Admitting: Internal Medicine

## 2023-04-19 LAB — SURGICAL PATHOLOGY

## 2023-12-02 ENCOUNTER — Encounter (HOSPITAL_COMMUNITY): Payer: Self-pay

## 2023-12-02 ENCOUNTER — Other Ambulatory Visit: Payer: Self-pay

## 2023-12-02 ENCOUNTER — Emergency Department (HOSPITAL_COMMUNITY)

## 2023-12-02 ENCOUNTER — Emergency Department (HOSPITAL_COMMUNITY)
Admission: EM | Admit: 2023-12-02 | Discharge: 2023-12-02 | Disposition: A | Discharge status: Home / Self Care | Attending: Emergency Medicine | Admitting: Emergency Medicine

## 2023-12-02 DIAGNOSIS — Z79899 Other long term (current) drug therapy: Secondary | ICD-10-CM | POA: Diagnosis not present

## 2023-12-02 DIAGNOSIS — I10 Essential (primary) hypertension: Secondary | ICD-10-CM | POA: Insufficient documentation

## 2023-12-02 DIAGNOSIS — M25561 Pain in right knee: Secondary | ICD-10-CM | POA: Diagnosis present

## 2023-12-02 NOTE — ED Provider Notes (Signed)
 [place] EMERGENCY DEPARTMENT AT [facility] Provider Note   CSN: [id number] Arrival date & time: 12/02/23  9241     Patient presents with: Knee Pain   Timothy Jennings is a 58 y.o. male.  Patient with past medical history of hypertension, hyperlipidemia, closed fracture of right tibial plateau presents to emergency room with complaint of right knee pain.  Patient reports when he was walking yesterday he took a step and felt a pop.  Since then he has had pain with walking.  He denies any laxity.  Locates pain to medial aspect of right knee.    Knee Pain      Prior to Admission medications   Medication Sig Start Date End Date Taking? Authorizing Provider  amLODipine  (NORVASC ) 10 MG tablet Take 1 tablet (10 mg total) by mouth daily. 11/03/21   Webb, Padonda B, FNP  hydrochlorothiazide  (HYDRODIURIL ) 25 MG tablet Take 1 tablet (25 mg total) by mouth daily. TAKE 1 TABLET DAILY (NEED AN APPOINTMENT) 11/03/21   Webb, Padonda B, FNP  pravastatin  (PRAVACHOL ) 40 MG tablet Take 1 tablet (40 mg total) by mouth daily. 11/03/21   Webb, Padonda B, FNP  sertraline  (ZOLOFT ) 100 MG tablet TAKE 1 TABLET BY MOUTH EVERY DAY 09/04/22   Webb, Padonda B, FNP    Allergies: Other    Review of Systems  Musculoskeletal:  Positive for arthralgias.    Updated Vital Signs BP (!) 151/108 (BP Location: Right Arm)   Pulse 79   Temp 98.3 F (36.8 C)   Resp 16   SpO2 96%   Physical Exam Vitals and nursing note reviewed.  Constitutional:      General: He is not in acute distress.    Appearance: He is not toxic-appearing.  HENT:     Head: Normocephalic and atraumatic.  Eyes:     General: No scleral icterus.    Conjunctiva/sclera: Conjunctivae normal.  Cardiovascular:     Rate and Rhythm: Normal rate and regular rhythm.     Pulses: Normal pulses.     Heart sounds: Normal heart sounds.  Pulmonary:     Effort: Pulmonary effort is normal. No respiratory distress.     Breath sounds: Normal breath  sounds.  Abdominal:     General: Abdomen is flat. Bowel sounds are normal.     Palpations: Abdomen is soft.     Tenderness: There is no abdominal tenderness.  Musculoskeletal:        General: Tenderness present.     Comments: There is to palpation over right medial aspect of knee.  No joint effusion on exam.  Neurovascularly intact distally.  Skin:    General: Skin is warm and dry.     Findings: No lesion.  Neurological:     General: No focal deficit present.     Mental Status: He is alert and oriented to person, place, and time. Mental status is at baseline.     (all labs ordered are listed, but only abnormal results are displayed) Labs Reviewed - No data to display  EKG: None  Radiology: DG Knee Complete 4 Views Right Result Date: 12/02/2023 CLINICAL DATA:  Right knee pain. EXAM: RIGHT KNEE - COMPLETE 4+ VIEW COMPARISON:  11/27/2017 and 12/08/2017 FINDINGS: Exam demonstrates mild-to-moderate tricompartmental osteoarthritic change most prominent over the lateral compartment and patellofemoral joints. Fixation hardware over the lateral proximal tibia intact and unchanged. No acute fracture or dislocation. Remainder of the exam is unchanged. IMPRESSION: 1. No acute findings. 2. Mild-to-moderate tricompartmental  osteoarthritic change. Electronically Signed   By: Toribio Agreste M.D.   On: 12/02/2023 08:48     Procedures   Medications Ordered in the ED - No data to display                                  Medical Decision Making Amount and/or Complexity of Data Reviewed Radiology: ordered.   This patient presents to the ED for concern of knee pain, this involves an extensive number of treatment options, and is a complaint that carries with it a high risk of complications and morbidity.  The differential diagnosis includes internal derangement, fracture, septic joint, gout   Imaging Studies ordered:  I ordered imaging studies including right knee x-ray I independently  visualized and interpreted imaging which showed no acute findings I agree with the radiologist interpretation   Problem List / ED Course / Critical interventions / Medication management  Patient reports emergency room with right knee pain after injury.  His x-ray shows no acute fracture.  He has no obvious abnormalities on exam but does have tenderness over medial aspect of knee.  No obvious laxity on exam.  He has been ambulating with painful gait.  He has good passive and active range of motion.  Good strength.  Intact distally.  Exam is not consistent with infection or cellulitis.  Feel most likely sprain of knee. Patient has crutches with him.  He will use crutches and knee brace provided in emergency room.  Follow-up with Dr. Sharl with [facility] for recheck of symptoms.  Use over-the-counter medications, elevation and compression for symptom control. I have reviewed the patients home medicines and have made adjustments as needed   Plan F/u w/ PCP in 2-3d to ensure resolution of sx.  Patient was given return precautions. Patient stable for discharge at this time.  Patient educated on sx/dx and verbalized understanding of plan. Return to ER w/ new or worsening sx.       Final diagnoses:  Acute pain of right knee    ED Discharge Orders     None          Shermon Warren SAILOR, PA-C 12/02/23 [id number]    Melvenia Motto, MD 12/02/23 [id number]

## 2023-12-02 NOTE — Discharge Instructions (Signed)
 You were seen today for right knee pain.  Your x-ray is normal.  Wear knee brace.  Use crutches as needed.  Alternate Tylenol  ibuprofen  and ice for pain control.  Call [facility] to schedule follow-up for today's visit.  Return to emergency room with new or worsening symptoms.

## 2023-12-02 NOTE — ED Triage Notes (Signed)
 Pt states he was walking yesterday when he heard something pop in his right knee and felt pain. Pt has hx of surgery in that knee in 2019 with screws placed.
